# Patient Record
Sex: Male | Born: 1970 | Race: Black or African American | Hispanic: No | Marital: Married | Smoking: Current every day smoker
Health system: Southern US, Community
[De-identification: ages and names within clinical notes are randomized; demographics above are authoritative.]

## PROBLEM LIST (undated history)

## (undated) ENCOUNTER — Emergency Department (HOSPITAL_COMMUNITY): Admission: EM | Payer: Self-pay

## (undated) DIAGNOSIS — I10 Essential (primary) hypertension: Secondary | ICD-10-CM

## (undated) HISTORY — PX: APPENDECTOMY: SHX54

---

## 2016-05-08 ENCOUNTER — Ambulatory Visit (INDEPENDENT_AMBULATORY_CARE_PROVIDER_SITE_OTHER): Payer: Self-pay | Admitting: Physician Assistant

## 2016-05-08 ENCOUNTER — Encounter (INDEPENDENT_AMBULATORY_CARE_PROVIDER_SITE_OTHER): Payer: Self-pay | Admitting: Physician Assistant

## 2016-05-08 VITALS — BP 149/79 | HR 60 | Temp 97.8°F | Ht 69.0 in | Wt 237.0 lb

## 2016-05-08 DIAGNOSIS — K0381 Cracked tooth: Secondary | ICD-10-CM | POA: Insufficient documentation

## 2016-05-08 DIAGNOSIS — F1910 Other psychoactive substance abuse, uncomplicated: Secondary | ICD-10-CM | POA: Insufficient documentation

## 2016-05-08 DIAGNOSIS — I10 Essential (primary) hypertension: Secondary | ICD-10-CM

## 2016-05-08 DIAGNOSIS — G4700 Insomnia, unspecified: Secondary | ICD-10-CM | POA: Insufficient documentation

## 2016-05-08 DIAGNOSIS — F418 Other specified anxiety disorders: Secondary | ICD-10-CM

## 2016-05-08 DIAGNOSIS — L709 Acne, unspecified: Secondary | ICD-10-CM | POA: Insufficient documentation

## 2016-05-08 MED ORDER — SALICYLIC ACID 2 % EX PADS: 1.0000 "application " | MEDICATED_PAD | Freq: Two times a day (BID) | CUTANEOUS | 0 refills | Status: DC

## 2016-05-08 MED ORDER — HYDROCHLOROTHIAZIDE 12.5 MG PO TABS: 12.5000 mg | ORAL_TABLET | Freq: Every day | ORAL | 3 refills | Status: DC

## 2016-05-08 MED ORDER — SERTRALINE HCL 50 MG PO TABS: 50.0000 mg | ORAL_TABLET | Freq: Every day | ORAL | 3 refills | Status: DC

## 2016-05-08 MED ORDER — MELATONIN 5 MG PO TABS: 1.0000 | ORAL_TABLET | Freq: Every day | ORAL | 2 refills | Status: DC

## 2016-05-08 NOTE — Patient Instructions (Signed)
Insomnia Insomnia is a sleep disorder that makes it difficult to fall asleep or to stay asleep. Insomnia can cause tiredness (fatigue), low energy, difficulty concentrating, mood swings, and poor performance at work or school. There are three different ways to classify insomnia:  Difficulty falling asleep.  Difficulty staying asleep.  Waking up too early in the morning. Any type of insomnia can be long-term (chronic) or short-term (acute). Both are common. Short-term insomnia usually lasts for three months or less. Chronic insomnia occurs at least three times a week for longer than three months. What are the causes? Insomnia may be caused by another condition, situation, or substance, such as:  Anxiety.  Certain medicines.  Gastroesophageal reflux disease (GERD) or other gastrointestinal conditions.  Asthma or other breathing conditions.  Restless legs syndrome, sleep apnea, or other sleep disorders.  Chronic pain.  Menopause. This may include hot flashes.  Stroke.  Abuse of alcohol, tobacco, or illegal drugs.  Depression.  Caffeine.  Neurological disorders, such as Alzheimer disease.  An overactive thyroid (hyperthyroidism). The cause of insomnia may not be known. What increases the risk? Risk factors for insomnia include:  Gender. Women are more commonly affected than men.  Age. Insomnia is more common as you get older.  Stress. This may involve your professional or personal life.  Income. Insomnia is more common in people with lower income.  Lack of exercise.  Irregular work schedule or night shifts.  Traveling between different time zones. What are the signs or symptoms? If you have insomnia, trouble falling asleep or trouble staying asleep is the main symptom. This may lead to other symptoms, such as:  Feeling fatigued.  Feeling nervous about going to sleep.  Not feeling rested in the morning.  Having trouble concentrating.  Feeling irritable,  anxious, or depressed. How is this treated? Treatment for insomnia depends on the cause. If your insomnia is caused by an underlying condition, treatment will focus on addressing the condition. Treatment may also include:  Medicines to help you sleep.  Counseling or therapy.  Lifestyle adjustments. Follow these instructions at home:  Take medicines only as directed by your health care provider.  Keep regular sleeping and waking hours. Avoid naps.  Keep a sleep diary to help you and your health care provider figure out what could be causing your insomnia. Include:  When you sleep.  When you wake up during the night.  How well you sleep.  How rested you feel the next day.  Any side effects of medicines you are taking.  What you eat and drink.  Make your bedroom a comfortable place where it is easy to fall asleep:  Put up shades or special blackout curtains to block light from outside.  Use a white noise machine to block noise.  Keep the temperature cool.  Exercise regularly as directed by your health care provider. Avoid exercising right before bedtime.  Use relaxation techniques to manage stress. Ask your health care provider to suggest some techniques that may work well for you. These may include:  Breathing exercises.  Routines to release muscle tension.  Visualizing peaceful scenes.  Cut back on alcohol, caffeinated beverages, and cigarettes, especially close to bedtime. These can disrupt your sleep.  Do not overeat or eat spicy foods right before bedtime. This can lead to digestive discomfort that can make it hard for you to sleep.  Limit screen use before bedtime. This includes:  Watching TV.  Using your smartphone, tablet, and computer.  Stick to a   routine. This can help you fall asleep faster. Try to do a quiet activity, brush your teeth, and go to bed at the same time each night.  Get out of bed if you are still awake after 15 minutes of trying to  sleep. Keep the lights down, but try reading or doing a quiet activity. When you feel sleepy, go back to bed.  Make sure that you drive carefully. Avoid driving if you feel very sleepy.  Keep all follow-up appointments as directed by your health care provider. This is important. Contact a health care provider if:  You are tired throughout the day or have trouble in your daily routine due to sleepiness.  You continue to have sleep problems or your sleep problems get worse. Get help right away if:  You have serious thoughts about hurting yourself or someone else. This information is not intended to replace advice given to you by your health care provider. Make sure you discuss any questions you have with your health care provider. Document Released: 02/01/2000 Document Revised: 07/06/2015 Document Reviewed: 11/04/2013 Elsevier Interactive Patient Education  2017 [employer] Hypertension Hypertension, commonly called high blood pressure, is when the force of blood pumping through the arteries is too strong. The arteries are the blood vessels that carry blood from the heart throughout the body. Hypertension forces the heart to work harder to pump blood and may cause arteries to become narrow or stiff. Having untreated or uncontrolled hypertension can cause heart attacks, strokes, kidney disease, and other problems. A blood pressure reading consists of a higher number over a lower number. Ideally, your blood pressure should be below 120/80. The first ("top") number is called the systolic pressure. It is a measure of the pressure in your arteries as your heart beats. The second ("bottom") number is called the diastolic pressure. It is a measure of the pressure in your arteries as the heart relaxes. What are the causes? The cause of this condition is not known. What increases the risk? Some risk factors for high blood pressure are under your control. Others are not. Factors you can change    Smoking.  Having type 2 diabetes mellitus, high cholesterol, or both.  Not getting enough exercise or physical activity.  Being overweight.  Having too much fat, sugar, calories, or salt (sodium) in your diet.  Drinking too much alcohol. Factors that are difficult or impossible to change   Having chronic kidney disease.  Having a family history of high blood pressure.  Age. Risk increases with age.  Race. You may be at higher risk if you are African-American.  Gender. Men are at higher risk than women before age 47. After age 47, women are at higher risk than men.  Having obstructive sleep apnea.  Stress. What are the signs or symptoms? Extremely high blood pressure (hypertensive crisis) may cause:  Headache.  Anxiety.  Shortness of breath.  Nosebleed.  Nausea and vomiting.  Severe chest pain.  Jerky movements you cannot control (seizures). How is this diagnosed? This condition is diagnosed by measuring your blood pressure while you are seated, with your arm resting on a surface. The cuff of the blood pressure monitor will be placed directly against the skin of your upper arm at the level of your heart. It should be measured at least twice using the same arm. Certain conditions can cause a difference in blood pressure between your right and left arms. Certain factors can cause blood pressure readings to be lower or higher  than normal (elevated) for a short period of time:  When your blood pressure is higher when you are in a health care provider's office than when you are at home, this is called white coat hypertension. Most people with this condition do not need medicines.  When your blood pressure is higher at home than when you are in a health care provider's office, this is called masked hypertension. Most people with this condition may need medicines to control blood pressure. If you have a high blood pressure reading during one visit or you have normal blood  pressure with other risk factors:  You may be asked to return on a different day to have your blood pressure checked again.  You may be asked to monitor your blood pressure at home for 1 week or longer. If you are diagnosed with hypertension, you may have other blood or imaging tests to help your health care provider understand your overall risk for other conditions. How is this treated? This condition is treated by making healthy lifestyle changes, such as eating healthy foods, exercising more, and reducing your alcohol intake. Your health care provider may prescribe medicine if lifestyle changes are not enough to get your blood pressure under control, and if:  Your systolic blood pressure is above 130.  Your diastolic blood pressure is above 80. Your personal target blood pressure may vary depending on your medical conditions, your age, and other factors. Follow these instructions at home: Eating and drinking   Eat a diet that is high in fiber and potassium, and low in sodium, added sugar, and fat. An example eating plan is called the DASH (Dietary Approaches to Stop Hypertension) diet. To eat this way:  Eat plenty of fresh fruits and vegetables. Try to fill half of your plate at each meal with fruits and vegetables.  Eat whole grains, such as whole wheat pasta, brown rice, or whole grain bread. Fill about one quarter of your plate with whole grains.  Eat or drink low-fat dairy products, such as skim milk or low-fat yogurt.  Avoid fatty cuts of meat, processed or cured meats, and poultry with skin. Fill about one quarter of your plate with lean proteins, such as fish, chicken without skin, beans, eggs, and tofu.  Avoid premade and processed foods. These tend to be higher in sodium, added sugar, and fat.  Reduce your daily sodium intake. Most people with hypertension should eat less than 1,500 mg of sodium a day.  Limit alcohol intake to no more than 1 drink a day for nonpregnant women  and 2 drinks a day for men. One drink equals 12 oz of beer, 5 oz of wine, or 1 oz of hard liquor. Lifestyle   Work with your health care provider to maintain a healthy body weight or to lose weight. Ask what an ideal weight is for you.  Get at least 30 minutes of exercise that causes your heart to beat faster (aerobic exercise) most days of the week. Activities may include walking, swimming, or biking.  Include exercise to strengthen your muscles (resistance exercise), such as pilates or lifting weights, as part of your weekly exercise routine. Try to do these types of exercises for 30 minutes at least 3 days a week.  Do not use any products that contain nicotine or tobacco, such as cigarettes and e-cigarettes. If you need help quitting, ask your health care provider.  Monitor your blood pressure at home as told by your health care provider.  Keep  all follow-up visits as told by your health care provider. This is important. Medicines   Take over-the-counter and prescription medicines only as told by your health care provider. Follow directions carefully. Blood pressure medicines must be taken as prescribed.  Do not skip doses of blood pressure medicine. Doing this puts you at risk for problems and can make the medicine less effective.  Ask your health care provider about side effects or reactions to medicines that you should watch for. Contact a health care provider if:  You think you are having a reaction to a medicine you are taking.  You have headaches that keep coming back (recurring).  You feel dizzy.  You have swelling in your ankles.  You have trouble with your vision. Get help right away if:  You develop a severe headache or confusion.  You have unusual weakness or numbness.  You feel faint.  You have severe pain in your chest or abdomen.  You vomit repeatedly.  You have trouble breathing. Summary  Hypertension is when the force of blood pumping through your  arteries is too strong. If this condition is not controlled, it may put you at risk for serious complications.  Your personal target blood pressure may vary depending on your medical conditions, your age, and other factors. For most people, a normal blood pressure is less than 120/80.  Hypertension is treated with lifestyle changes, medicines, or a combination of both. Lifestyle changes include weight loss, eating a healthy, low-sodium diet, exercising more, and limiting alcohol. This information is not intended to replace advice given to you by your health care provider. Make sure you discuss any questions you have with your health care provider. Document Released: 02/03/2005 Document Revised: 01/02/2016 Document Reviewed: 01/02/2016 Elsevier Interactive Patient Education  2017 [employer].

## 2016-05-08 NOTE — Progress Notes (Signed)
Subjective:  Patient ID: Matthew Kaufman, male    DOB: 12/24/1970  Age: 46 y.o. MRN: [id number]  CC: establish care  HPI Matthew Kaufman is a 46 y.o. male with a PMH of HTN, Substance abuse, Depression, Anxiety, and Insomnia presents to address these issues. He reports sobriety for years now. Feels depressed and anxious about his current situation but is [facility]. Insomnia has frustrated his sleep but feels he may be somewhat better recently. No known etiology for insomnia. Denies CP, SOB, HA, abdominal pain, f/c/n/v, rash, or GI/GU sxs.      ROS Review of Systems  Constitutional: Negative for chills, fever and malaise/fatigue.  HENT:       Cracked incisor  Eyes: Negative for blurred vision.  Respiratory: Negative for shortness of breath.   Cardiovascular: Negative for chest pain and palpitations.  Gastrointestinal: Negative for abdominal pain and nausea.  Genitourinary: Negative for dysuria and hematuria.  Musculoskeletal: Negative for joint pain and myalgias.  Skin: Positive for rash (on buttock bilaterally, suspects acne).  Neurological: Negative for tingling and headaches.  Psychiatric/Behavioral: Positive for depression. The patient is nervous/anxious and has insomnia.     Objective:  BP (!) 149/79 (BP Location: Left Arm, Patient Position: Sitting, Cuff Size: Large)   Pulse 60   Temp 97.8 F (36.6 C) (Oral)   Ht 5\' 9"  (1.753 m)   Wt 237 lb (107.5 kg)   SpO2 100%   BMI 35.00 kg/m   BP/Weight 05/08/2016  Systolic BP 149  Diastolic BP 79  Wt. (Lbs) 237  BMI 35      Physical Exam  Constitutional: He is oriented to person, place, and time.  Well developed, overweight, NAD, polite  HENT:  Head: Normocephalic and atraumatic.  Tooth number nine damaged distally with protrusion of 2mm of thin wire at the tip of tooth  Eyes: Conjunctivae are normal. No scleral icterus.  Neck: Normal range of motion. Neck supple. No thyromegaly present.   Cardiovascular: Normal rate, regular rhythm and normal heart sounds.   Pulmonary/Chest: Effort normal and breath sounds normal.  Abdominal: Soft. Bowel sounds are normal. There is no tenderness.  Musculoskeletal: He exhibits no edema.  Neurological: He is alert and oriented to person, place, and time.  Skin: Skin is warm and dry. No rash noted. He is not diaphoretic. No erythema. No pallor.  Psychiatric: He has a normal mood and affect. His behavior is normal. Thought content normal.  Vitals reviewed.    Assessment & Plan:   1. Hypertension, unspecified type - CBC With Differential - Comprehensive metabolic panel - TSH - Lipid Panel - hydrochlorothiazide (HYDRODIURIL) 12.5 MG tablet; Take 1 tablet (12.5 mg total) by mouth daily.  Dispense: 90 tablet; Refill: 3  2. Depression with anxiety - sertraline (ZOLOFT) 50 MG tablet; Take 1 tablet (50 mg total) by mouth daily.  Dispense: 30 tablet; Refill: 3  3. Insomnia, unspecified type - sertraline (ZOLOFT) 50 MG tablet; Take 1 tablet (50 mg total) by mouth daily.  Dispense: 30 tablet; Refill: 3 - Melatonin 5 MG TABS; Take 1 tablet (5 mg total) by mouth at bedtime.  Dispense: 30 tablet; Refill: 2  4. Substance abuse - Reports sobriety - Will assess drug panel at future visit.  5. Cracked tooth - Wire from implant is visibly protruding from tooth. Concern for damage to lips and subsequent infections. - Ambulatory referral to Dentistry  6. Acne, unspecified acne type - Acne on buttocks. - Salicylic Acid 2 %  PADS; Apply 1 application topically 2 (two) times daily.  Dispense: 60 each; Refill: 0   Meds ordered this encounter  Medications  . hydrochlorothiazide (HYDRODIURIL) 12.5 MG tablet    Sig: Take 1 tablet (12.5 mg total) by mouth daily.    Dispense:  90 tablet    Refill:  3    Order Specific Question:   Supervising Provider    Answer:   Quentin Angst [id number]  . sertraline (ZOLOFT) 50 MG tablet    Sig: Take 1 tablet  (50 mg total) by mouth daily.    Dispense:  30 tablet    Refill:  3    Order Specific Question:   Supervising Provider    Answer:   Quentin Angst [id number]  . Melatonin 5 MG TABS    Sig: Take 1 tablet (5 mg total) by mouth at bedtime.    Dispense:  30 tablet    Refill:  2    Order Specific Question:   Supervising Provider    Answer:   Quentin Angst [id number]  . Salicylic Acid 2 % PADS    Sig: Apply 1 application topically 2 (two) times daily.    Dispense:  60 each    Refill:  0    Order Specific Question:   Supervising Provider    Answer:   Quentin Angst [id number]    Follow-up: Return in about 4 weeks (around 06/05/2016) for f/u htn, depression, and insomnia.   Loletta Specter PA

## 2016-05-09 LAB — COMPREHENSIVE METABOLIC PANEL
ALK PHOS: 71 IU/L (ref 39–117)
ALT: 26 IU/L (ref 0–44)
AST: 33 IU/L (ref 0–40)
Albumin/Globulin Ratio: 1.8 (ref 1.2–2.2)
Albumin: 4.6 g/dL (ref 3.5–5.5)
BUN / CREAT RATIO: 12 (ref 9–20)
BUN: 12 mg/dL (ref 6–24)
Bilirubin Total: 0.3 mg/dL (ref 0.0–1.2)
CO2: 21 mmol/L (ref 18–29)
CREATININE: 1.02 mg/dL (ref 0.76–1.27)
Calcium: 9.2 mg/dL (ref 8.7–10.2)
Chloride: 100 mmol/L (ref 96–106)
GFR calc Af Amer: 101 mL/min/{1.73_m2} (ref >59)
GFR calc non Af Amer: 87 mL/min/{1.73_m2} (ref >59)
GLOBULIN, TOTAL: 2.6 g/dL (ref 1.5–4.5)
GLUCOSE: 110 mg/dL — AB (ref 65–99)
Potassium: 4.4 mmol/L (ref 3.5–5.2)
SODIUM: 141 mmol/L (ref 134–144)
Total Protein: 7.2 g/dL (ref 6.0–8.5)

## 2016-05-09 LAB — LIPID PANEL
CHOLESTEROL TOTAL: 199 mg/dL (ref 100–199)
Chol/HDL Ratio: 4.1 ratio units (ref 0.0–5.0)
HDL: 49 mg/dL (ref >39)
LDL Calculated: 122 mg/dL — ABNORMAL HIGH (ref 0–99)
Triglycerides: 141 mg/dL (ref 0–149)
VLDL CHOLESTEROL CAL: 28 mg/dL (ref 5–40)

## 2016-05-09 LAB — CBC WITH DIFFERENTIAL
Basophils Absolute: 0 10*3/uL (ref 0.0–0.2)
Basos: 0 %
EOS (ABSOLUTE): 0.1 10*3/uL (ref 0.0–0.4)
Eos: 1 %
HEMATOCRIT: 40.6 % (ref 37.5–51.0)
Hemoglobin: 13.4 g/dL (ref 13.0–17.7)
IMMATURE GRANULOCYTES: 0 %
Immature Grans (Abs): 0 10*3/uL (ref 0.0–0.1)
LYMPHS ABS: 3.2 10*3/uL — AB (ref 0.7–3.1)
Lymphs: 41 %
MCH: 27.5 pg (ref 26.6–33.0)
MCHC: 33 g/dL (ref 31.5–35.7)
MCV: 83 fL (ref 79–97)
MONOS ABS: 0.8 10*3/uL (ref 0.1–0.9)
Monocytes: 10 %
NEUTROS ABS: 3.7 10*3/uL (ref 1.4–7.0)
Neutrophils: 48 %
RBC: 4.87 x10E6/uL (ref 4.14–5.80)
RDW: 15.2 % (ref 12.3–15.4)
WBC: 7.8 10*3/uL (ref 3.4–10.8)

## 2016-05-09 LAB — TSH: TSH: 0.706 u[IU]/mL (ref 0.450–4.500)

## 2016-05-21 ENCOUNTER — Encounter (INDEPENDENT_AMBULATORY_CARE_PROVIDER_SITE_OTHER): Payer: Self-pay

## 2016-06-13 ENCOUNTER — Ambulatory Visit (HOSPITAL_COMMUNITY)
Admission: EM | Admit: 2016-06-13 | Discharge: 2016-06-13 | Disposition: A | Discharge status: Home / Self Care | Payer: Self-pay | Attending: Family Medicine | Admitting: Family Medicine

## 2016-06-13 ENCOUNTER — Encounter (HOSPITAL_COMMUNITY): Payer: Self-pay | Admitting: Family Medicine

## 2016-06-13 DIAGNOSIS — K529 Noninfective gastroenteritis and colitis, unspecified: Secondary | ICD-10-CM

## 2016-06-13 DIAGNOSIS — R197 Diarrhea, unspecified: Secondary | ICD-10-CM

## 2016-06-13 DIAGNOSIS — R110 Nausea: Secondary | ICD-10-CM

## 2016-06-13 MED ORDER — ONDANSETRON 4 MG PO TBDP
4.0000 mg | ORAL_TABLET | Freq: Once | ORAL | Status: AC
  Administered 2016-06-13: 4 mg via ORAL

## 2016-06-13 MED ORDER — ONDANSETRON 4 MG PO TBDP
ORAL_TABLET | ORAL | Status: AC
  Filled 2016-06-13: qty 1

## 2016-06-13 NOTE — ED Triage Notes (Signed)
Pt here for N,V,D that started yesterday. sts that he has been around someone that had the same symptoms and sleeping in tight quarters.

## 2016-06-13 NOTE — ED Provider Notes (Signed)
CSN: [id number]     Arrival date & time 06/13/16  1559 History   None    Chief Complaint  Patient presents with  . Nausea  . Emesis  . Diarrhea   (Consider location/radiation/quality/duration/timing/severity/associated sxs/prior Treatment) Patient c/o NVD x 1 day.     The history is provided by the patient.  Emesis  Severity:  Moderate Duration:  1 day Timing:  Constant Number of daily episodes:  2 Quality:  Unable to specify Able to tolerate:  Liquids Progression:  Worsening Recent urination:  Normal Relieved by:  Nothing Worsened by:  Nothing Associated symptoms: diarrhea   Diarrhea  Associated symptoms: vomiting     History reviewed. No pertinent past medical history. History reviewed. No pertinent surgical history. History reviewed. No pertinent family history. Social History  Substance Use Topics  . Smoking status: Former Games developer  . Smokeless tobacco: Never Used  . Alcohol use Not on file    Review of Systems  Constitutional: Positive for fatigue.  HENT: Negative.   Eyes: Negative.   Respiratory: Negative.   Cardiovascular: Negative.   Gastrointestinal: Positive for diarrhea and vomiting.  Endocrine: Negative.   Genitourinary: Negative.   Musculoskeletal: Negative.   Allergic/Immunologic: Negative.   Neurological: Negative.   Hematological: Negative.   Psychiatric/Behavioral: Negative.     Allergies  Amoxicillin and Doxycycline  Home Medications   Prior to Admission medications   Medication Sig Start Date End Date Taking? Authorizing Provider  hydrochlorothiazide (HYDRODIURIL) 12.5 MG tablet Take 1 tablet (12.5 mg total) by mouth daily. 05/08/16   Roger Mariana Arn, PA-C  Melatonin 5 MG TABS Take 1 tablet (5 mg total) by mouth at bedtime. 05/08/16   Roger Mariana Arn, PA-C  Salicylic Acid 2 % PADS Apply 1 application topically 2 (two) times daily. 05/08/16   Roger Mariana Arn, PA-C  sertraline (ZOLOFT) 50 MG tablet Take 1 tablet (50 mg total) by  mouth daily. 05/08/16   Loletta Specter, PA-C   Meds Ordered and Administered this Visit   Medications  ondansetron (ZOFRAN-ODT) disintegrating tablet 4 mg (4 mg Oral Given 06/13/16 1650)    BP 128/83   Pulse 69   Temp 98.4 F (36.9 C)   Resp 18   SpO2 100%  No data found.   Physical Exam  Constitutional: He is oriented to person, place, and time. He appears well-developed and well-nourished.  HENT:  Head: Normocephalic and atraumatic.  Right Ear: External ear normal.  Left Ear: External ear normal.  Mouth/Throat: Oropharynx is clear and moist.  Eyes: Conjunctivae and EOM are normal. Pupils are equal, round, and reactive to light.  Neck: Normal range of motion. Neck supple.  Cardiovascular: Normal rate, regular rhythm and normal heart sounds.   Pulmonary/Chest: Effort normal and breath sounds normal.  Abdominal: Soft. Bowel sounds are normal.  Neurological: He is alert and oriented to person, place, and time.  Nursing note and vitals reviewed.   Urgent Care Course     Procedures (including critical care time)  Labs Review Labs Reviewed - No data to display  Imaging Review No results found.   Visual Acuity Review  Right Eye Distance:   Left Eye Distance:   Bilateral Distance:    Right Eye Near:   Left Eye Near:    Bilateral Near:         MDM   1. Gastroenteritis   2. Nausea   3. Diarrhea, unspecified type    Zofran ODT  one po now  Push  po fluids, rest, tylenol and motrin otc prn as directed for fever, arthralgias, and myalgias.  Follow up prn if sx's continue or persist.  Note to be on bedrest at shelter      Deatra Canter, FNP 06/13/16 1738

## 2016-07-02 ENCOUNTER — Ambulatory Visit (INDEPENDENT_AMBULATORY_CARE_PROVIDER_SITE_OTHER): Payer: Self-pay | Admitting: Physician Assistant

## 2016-07-24 NOTE — Congregational Nurse Program (Signed)
Congregational Nurse Program Note  Date of Encounter: 06/30/2016  Past Medical History: No past medical history on file.  Encounter Details:     CNP Questionnaire - 06/30/16 1756      Patient Demographics   Is this a new or existing patient? New   Patient is considered a/an Not Applicable   Race African-American/Black     Patient Assistance   Location of Patient Assistance Not Applicable   Patient's financial/insurance status Self-Pay (Uninsured);Low Income   Uninsured Patient ([place] [employer]) Yes   Interventions Assisted patient in making appt.   Patient referred to apply for the following financial assistance Not Applicable   Food insecurities addressed Not Applicable   Transportation assistance No   Assistance securing medications No   Educational health offerings Navigating the healthcare system     Encounter Details   Primary purpose of visit Chronic Illness/Condition Visit;Navigating the Healthcare System   Was an Emergency Department visit averted? Not Applicable   Does patient have a medical provider? No   Patient referred to Clinic   Was a mental health screening completed? (GAINS tool) No   Does patient have dental issues? No   Does patient have vision issues? No   Does your patient have an abnormal blood pressure today? No   Since previous encounter, have you referred patient for abnormal blood pressure that resulted in a new diagnosis or medication change? No   Does your patient have an abnormal blood glucose today? No   Since previous encounter, have you referred patient for abnormal blood glucose that resulted in a new diagnosis or medication change? No     Client reports was [facility] and forgot his meds.  States his meds were discarded by the staff.  Is on an anti-hypertensive.  B/P 140/96.  Strongly encouraged him to see Lavinia SharpsMary Ann Placey NP at [facility] for followup and medication management

## 2016-11-04 ENCOUNTER — Emergency Department (HOSPITAL_COMMUNITY)
Admission: EM | Admit: 2016-11-04 | Discharge: 2016-11-04 | Disposition: A | Discharge status: Home / Self Care | Payer: Self-pay | Attending: Emergency Medicine | Admitting: Emergency Medicine

## 2016-11-04 ENCOUNTER — Emergency Department (HOSPITAL_COMMUNITY): Payer: Self-pay

## 2016-11-04 ENCOUNTER — Encounter (HOSPITAL_COMMUNITY): Payer: Self-pay | Admitting: *Deleted

## 2016-11-04 DIAGNOSIS — Z202 Contact with and (suspected) exposure to infections with a predominantly sexual mode of transmission: Secondary | ICD-10-CM | POA: Insufficient documentation

## 2016-11-04 DIAGNOSIS — R1084 Generalized abdominal pain: Secondary | ICD-10-CM | POA: Insufficient documentation

## 2016-11-04 DIAGNOSIS — I10 Essential (primary) hypertension: Secondary | ICD-10-CM | POA: Insufficient documentation

## 2016-11-04 DIAGNOSIS — F17210 Nicotine dependence, cigarettes, uncomplicated: Secondary | ICD-10-CM | POA: Insufficient documentation

## 2016-11-04 DIAGNOSIS — Z79899 Other long term (current) drug therapy: Secondary | ICD-10-CM | POA: Insufficient documentation

## 2016-11-04 HISTORY — DX: Essential (primary) hypertension: I10

## 2016-11-04 LAB — URINALYSIS, ROUTINE W REFLEX MICROSCOPIC
BACTERIA UA: NONE SEEN
BILIRUBIN URINE: NEGATIVE
Glucose, UA: NEGATIVE mg/dL
Ketones, ur: NEGATIVE mg/dL
Leukocytes, UA: NEGATIVE
Nitrite: NEGATIVE
PH: 5 (ref 5.0–8.0)
PROTEIN: NEGATIVE mg/dL
Specific Gravity, Urine: 1.018 (ref 1.005–1.030)

## 2016-11-04 MED ORDER — GENTAMICIN SULFATE 40 MG/ML IJ SOLN
240.0000 mg | Freq: Once | INTRAMUSCULAR | Status: AC
  Administered 2016-11-04: 240 mg via INTRAMUSCULAR
  Filled 2016-11-04: qty 6

## 2016-11-04 MED ORDER — AZITHROMYCIN 250 MG PO TABS
2000.0000 mg | ORAL_TABLET | Freq: Once | ORAL | Status: AC
  Administered 2016-11-04: 2000 mg via ORAL
  Filled 2016-11-04: qty 8

## 2016-11-04 NOTE — ED Notes (Signed)
Pt alert and oriented. Brought back into triage for re-eval. Pt appears in nad. Wait time acknowledged. Appears in nad.

## 2016-11-04 NOTE — ED Notes (Signed)
Pt to xray

## 2016-11-04 NOTE — Discharge Instructions (Signed)
Please read attached information regarding your condition. Follow-up with the results of remaining lab work when available. Return to ED for worsening pain, increase in blood, severe abdominal pain, fevers, increased vomiting.

## 2016-11-04 NOTE — ED Provider Notes (Signed)
[facility]-EMERGENCY DEPT Provider Note   CSN: [id number] Arrival date & time: 11/04/16  0015     History   Chief Complaint Chief Complaint  Patient presents with  . Hematuria    HPI Matthew Kaufman is a 46 y.o. male.  HPI  Patient presents to ED for evaluation of one episode of blood in ejaculate 2 days ago. He also reports intermittent abdominal pain in the morning. He has not noticed blood in the ejaculate since the incident that occurred 2 days ago. He denies any previous history of similar symptoms. He states that he is concerned about STD because he thinks his partner is cheating on him. He denies any hematuria, history of gastric ulcers, chronic NSAID use, history of kidney stones, history of liver disease, history of prostate issues, fever, bowel changes, vomiting, nausea. He reports daily alcohol and tobacco use. He also reports marijuana use.  Past Medical History:  Diagnosis Date  . Hypertension     Patient Active Problem List   Diagnosis Date Noted  . HTN (hypertension) 05/08/2016  . Depression with anxiety 05/08/2016  . Insomnia 05/08/2016  . Substance abuse 05/08/2016  . Cracked tooth 05/08/2016  . Acne 05/08/2016    Past Surgical History:  Procedure Laterality Date  . APPENDECTOMY         Home Medications    Prior to Admission medications   Medication Sig Start Date End Date Taking? Authorizing Provider  hydrochlorothiazide (HYDRODIURIL) 12.5 MG tablet Take 1 tablet (12.5 mg total) by mouth daily. 05/08/16   Loletta Specter, PA-C  Melatonin 5 MG TABS Take 1 tablet (5 mg total) by mouth at bedtime. 05/08/16   Loletta Specter, PA-C  Salicylic Acid 2 % PADS Apply 1 application topically 2 (two) times daily. 05/08/16   Loletta Specter, PA-C  sertraline (ZOLOFT) 50 MG tablet Take 1 tablet (50 mg total) by mouth daily. 05/08/16   Loletta Specter, PA-C    Family History No family history on file.  Social History Social History  Substance Use  Topics  . Smoking status: Current Every Day Smoker  . Smokeless tobacco: Never Used  . Alcohol use Yes     Allergies   Amoxicillin and Doxycycline   Review of Systems Review of Systems  Constitutional: Negative for appetite change, chills and fever.  HENT: Negative for ear pain, rhinorrhea, sneezing and sore throat.   Eyes: Negative for photophobia and visual disturbance.  Respiratory: Negative for cough, chest tightness, shortness of breath and wheezing.   Cardiovascular: Negative for chest pain and palpitations.  Gastrointestinal: Positive for abdominal pain. Negative for blood in stool, constipation, diarrhea, nausea and vomiting.  Genitourinary: Negative for difficulty urinating, dysuria, hematuria and urgency.       Bloody ejaculate  Musculoskeletal: Negative for myalgias.  Skin: Negative for rash.  Neurological: Negative for dizziness, weakness and light-headedness.     Physical Exam Updated Vital Signs BP (!) 149/97 (BP Location: Right Arm)   Pulse 76   Temp 98 F (36.7 C) (Oral)   Resp 16   Ht  (1.753 m)   Wt 92.5 kg (204 lb)   SpO2 99%   BMI 30.13 kg/m   Physical Exam  Constitutional: He appears well-developed and well-nourished. No distress.  HENT:  Head: Normocephalic and atraumatic.  Eyes: Conjunctivae and EOM are normal. No scleral icterus.  Neck: Normal range of motion.  Cardiovascular: Normal rate, regular rhythm and normal heart sounds.   Pulmonary/Chest: Effort normal and breath  sounds normal. No respiratory distress.  Abdominal: Soft. Bowel sounds are normal. He exhibits no distension. There is no tenderness.  No tenderness to palpation of the abdomen currently.  Neurological: He is alert.  Skin: No rash noted. He is not diaphoretic.  Psychiatric: He has a normal mood and affect.  Nursing note and vitals reviewed.    ED Treatments / Results  Labs (all labs ordered are listed, but only abnormal results are displayed) Labs Reviewed    URINALYSIS, ROUTINE W REFLEX MICROSCOPIC - Abnormal; Notable for the following:       Result Value   Hgb urine dipstick SMALL (*)    Squamous Epithelial / LPF 0-5 (*)    All other components within normal limits  GC/CHLAMYDIA PROBE AMP ([place]) NOT AT [facility]    EKG  EKG Interpretation None       Radiology Dg Abdomen Acute W/chest  Result Date: 11/04/2016 CLINICAL DATA:  Blood in semen EXAM: DG ABDOMEN ACUTE W/ 1V CHEST COMPARISON:  None. FINDINGS: There is no evidence of dilated bowel loops or free intraperitoneal air. No radiopaque calculi or other significant radiographic abnormality is seen. Heart size and mediastinal contours are within normal limits. Both lungs are clear. Postsurgical changes in the right lower quadrant are noted consistent with prior appendectomy. IMPRESSION: Negative abdominal radiographs.  No acute cardiopulmonary disease. Electronically Signed   By: Alcide Clever M.D.   On: 11/04/2016 10:11    Procedures Procedures (including critical care time)  Medications Ordered in ED Medications  gentamicin (GARAMYCIN) injection 240 mg (240 mg Intramuscular Given 11/04/16 1026)  azithromycin (ZITHROMAX) tablet 2,000 mg (2,000 mg Oral Given 11/04/16 1610)     Initial Impression / Assessment and Plan / ED Course  I have reviewed the triage vital signs and the nursing notes.  Pertinent labs & imaging results that were available during my care of the patient were reviewed by me and considered in my medical decision making (see chart for details).     Patient presents to ED for evaluation of small amount of blood inject relation 2 days ago. States that symptoms have not occurred since the incident. He also reports intermittent abdominal pain for the past several weeks. States that he wants to make sure that he does not have an ulcer. He denies any vomiting, lightheadedness, lower abdominal pain, urinary symptoms, fever, chills, prostate issues. Physical exam there is  no tenderness to palpation of the abdomen currently. He is nontoxic-appearing and in no acute distress. He is concerned about STD exposure because he is concerned that his partner is cheating on him. GC chlamydia probe collected via urine patient was treated empirically. Due to his amoxicillin and doxycycline allergy, I contacted pharmacy who recommends gentamicin and azithromycin treatment. Patient is unsure if he has tried cephalosporins in the past. Abdominal x-ray and chest x-ray were obtained and showed no acute abnormalities. I encouraged patient to follow up with results of remaining lab work at this time. I suspect that his symptoms are most likely due to STD exposure based on his lack of liver disease, prostate issues in the past. Patient states that he will follow-up with PCP for high blood pressure. Patient appears stable for discharge at this time. Strict return precautions given.  Final Clinical Impressions(s) / ED Diagnoses   Final diagnoses:  Possible exposure to STD    New Prescriptions New Prescriptions   No medications on file     Dietrich Pates, [place] 11/04/16 1042  Melene Plan, DO 11/04/16 1406

## 2016-11-04 NOTE — ED Notes (Addendum)
Pt was approached in the corner of the lobby and found with headphones on. Pt's armband was checked and was told that we called his name for a room, pt stated he might have been outside smoking at the time.

## 2016-11-04 NOTE — ED Triage Notes (Signed)
Pt has noticed blood in semen after ejaculation. Reports burning abd pain only in the morning

## 2016-11-04 NOTE — ED Notes (Signed)
Did not respond to name X3

## 2016-12-22 ENCOUNTER — Ambulatory Visit (HOSPITAL_COMMUNITY)
Admission: RE | Admit: 2016-12-22 | Discharge: 2016-12-22 | Disposition: A | Discharge status: Home / Self Care | Payer: Self-pay | Attending: Psychiatry | Admitting: Psychiatry

## 2016-12-22 DIAGNOSIS — Z79899 Other long term (current) drug therapy: Secondary | ICD-10-CM | POA: Insufficient documentation

## 2016-12-22 DIAGNOSIS — F1210 Cannabis abuse, uncomplicated: Secondary | ICD-10-CM | POA: Insufficient documentation

## 2016-12-22 DIAGNOSIS — I10 Essential (primary) hypertension: Secondary | ICD-10-CM | POA: Insufficient documentation

## 2016-12-22 DIAGNOSIS — Z046 Encounter for general psychiatric examination, requested by authority: Secondary | ICD-10-CM | POA: Insufficient documentation

## 2016-12-22 DIAGNOSIS — F1010 Alcohol abuse, uncomplicated: Secondary | ICD-10-CM | POA: Insufficient documentation

## 2016-12-22 DIAGNOSIS — R45851 Suicidal ideations: Secondary | ICD-10-CM | POA: Insufficient documentation

## 2016-12-22 DIAGNOSIS — F329 Major depressive disorder, single episode, unspecified: Secondary | ICD-10-CM | POA: Insufficient documentation

## 2016-12-22 DIAGNOSIS — F1410 Cocaine abuse, uncomplicated: Secondary | ICD-10-CM | POA: Insufficient documentation

## 2016-12-22 DIAGNOSIS — F1910 Other psychoactive substance abuse, uncomplicated: Secondary | ICD-10-CM | POA: Insufficient documentation

## 2016-12-22 DIAGNOSIS — F17200 Nicotine dependence, unspecified, uncomplicated: Secondary | ICD-10-CM | POA: Insufficient documentation

## 2016-12-22 NOTE — H&P (Signed)
Behavioral Health Medical Screening Exam  Matthew Kaufman is an 46 y.o. male, [facility], previously evaluated per TTS, Matthew Kaufman endorses suicidal ideations.  Has been thinking of cutting his wrists.  He has had a previous suicide attempt by same.  Patient has been very depressed.  His sister died in March of this year.  He had been living in [place] and came up because of that.  Patient has family that lives in [place].  Patient has been living with a girlfriend.  He and her have had a falling out and patient does not have housing at this time.  Patient says he has racing thoughts about wanting to harm her.  Although he says he has thoughts of killing her, he does not intend to or have a plan to.  Patient says he has been in jail for a long time (27 years cumulative) and does not want to go back.  Patient says he has times when he hears voices but that it is not all the time.  He says it is usually when he is having some agitation or anger.  Patient says he gets to feeling paranoid because he thinks someone may be wanting to do him harm .  He says that this is because of his past in [place] and he thinks at times someone may recognize him and do him harm.  Patient says he drinks two 5th of wine and a 6 pack of beer daily and last use was today in the amount of 4 beers.  Patient smokes marijuana daily but cannot quantify how much, last use today.  Patient also uses cocaine and this frequency and amount varies according to finances, last use today.    Total Time spent with patient: 20 minutes  Psychiatric Specialty Exam: Physical Exam  Constitutional: He is oriented to person, place, and time. He appears well-developed and well-nourished. No distress.  HENT:  Head: Normocephalic.  Eyes: Pupils are equal, round, and reactive to light.  Respiratory: Effort normal and breath sounds normal.  Neurological: He is alert and oriented to person, place, and time. No  cranial nerve deficit.  Skin: Skin is warm and dry. He is not diaphoretic.  Psychiatric: His speech is normal. His mood appears anxious. He is agitated and withdrawn. Thought content is paranoid. Cognition and memory are impaired. He expresses impulsivity. He exhibits a depressed mood. He expresses suicidal ideation. He expresses suicidal plans.    Review of Systems  Psychiatric/Behavioral: Positive for depression, hallucinations, substance abuse and suicidal ideas. The patient is nervous/anxious and has insomnia.   All other systems reviewed and are negative.   There were no vitals taken for this visit.There is no height or weight on file to calculate BMI.  General Appearance: Casual  Eye Contact:  Good  Speech:  Clear and Coherent  Volume:  Normal  Mood:  Depressed  Affect:  Congruent  Thought Process:  Goal Directed  Orientation:  Full (Time, Place, and Person)  Thought Content:  Logical and Hallucinations: Auditory  Suicidal Thoughts:  Yes.  with intent/plan  Homicidal Thoughts:  No  Memory:  Immediate;   Good  Judgement:  Impaired  Insight:  Lacking  Psychomotor Activity:  Negative  Concentration: Concentration: Good  Recall:  Good  Fund of Knowledge:Fair  Language: Good  Akathisia:  Negative  Handed:  Right  AIMS (if indicated):     Assets:  Desire for Improvement  Sleep:       Musculoskeletal:  Strength & Muscle Tone: within normal limits Gait & Station: normal Patient leans: N/A  There were no vitals taken for this visit.  Recommendations:  Based on my evaluation the patient appears to have an emergency medical condition for which I recommend the patient be transferred to the emergency department for further evaluation.  Kerry Hough, PA-C 12/22/2016, 10:29 PM

## 2016-12-22 NOTE — Congregational Nurse Program (Signed)
Congregational Nurse Program Note  Date of Encounter: 12/22/2016  Past Medical History: Past Medical History:  Diagnosis Date  . Hypertension     Encounter Details: CNP Questionnaire - 12/22/16 1441      Questionnaire   Patient Status  Not Applicable    Race  Black or African American    Location Patient Served At  Not Applicable    Uninsured  Uninsured (NEW 1x/quarter)    Food  Yes, have food insecurities;Within past 12 months, worried food would run out with no money to buy more;Within past 12 months, food ran out with no money to buy more    Housing/Utilities  No permanent housing    Transportation  Yes, need transportation assistance    Interpersonal Safety  Yes, feel physically and emotionally safe where you currently live    Medication  Yes, have medication insecurities    Medical Provider  Yes    Referrals  Emergency Department    ED Visit Averted  Not Applicable    Life-Saving Intervention Made  Not Applicable      C/o of head ache and stomach ache.  Requested B/P to be taken.  160/110.  Pulse rate 123.  States in general does not feel "right".  Instructed client that he needed to be seen by a HCP.  Awaiting transportation from a friend to go the ED.  Once client has been seen we will work with him to obtain a HCP and obtain medications
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
## 2016-12-23 ENCOUNTER — Encounter (HOSPITAL_COMMUNITY): Payer: Self-pay

## 2016-12-23 ENCOUNTER — Other Ambulatory Visit: Payer: Self-pay

## 2016-12-23 ENCOUNTER — Emergency Department (HOSPITAL_COMMUNITY)
Admission: EM | Admit: 2016-12-23 | Discharge: 2016-12-23 | Disposition: A | Discharge status: Other Acute Inpatient Hospital | Payer: Self-pay | Attending: Emergency Medicine | Admitting: Emergency Medicine

## 2016-12-23 ENCOUNTER — Inpatient Hospital Stay (HOSPITAL_COMMUNITY)
Admission: AD | Admit: 2016-12-23 | Discharge: 2016-12-26 | DRG: 885 | Disposition: A | Discharge status: Home / Self Care | Payer: Federal, State, Local not specified - Other | Source: Intra-hospital | Attending: Psychiatry | Admitting: Psychiatry

## 2016-12-23 DIAGNOSIS — G4700 Insomnia, unspecified: Secondary | ICD-10-CM | POA: Diagnosis present

## 2016-12-23 DIAGNOSIS — I10 Essential (primary) hypertension: Secondary | ICD-10-CM | POA: Diagnosis present

## 2016-12-23 DIAGNOSIS — F209 Schizophrenia, unspecified: Secondary | ICD-10-CM | POA: Diagnosis present

## 2016-12-23 DIAGNOSIS — Z634 Disappearance and death of family member: Secondary | ICD-10-CM

## 2016-12-23 DIAGNOSIS — Z915 Personal history of self-harm: Secondary | ICD-10-CM

## 2016-12-23 DIAGNOSIS — R45851 Suicidal ideations: Secondary | ICD-10-CM | POA: Diagnosis present

## 2016-12-23 DIAGNOSIS — F1220 Cannabis dependence, uncomplicated: Secondary | ICD-10-CM

## 2016-12-23 DIAGNOSIS — F10239 Alcohol dependence with withdrawal, unspecified: Secondary | ICD-10-CM | POA: Diagnosis present

## 2016-12-23 DIAGNOSIS — R5383 Other fatigue: Secondary | ICD-10-CM

## 2016-12-23 DIAGNOSIS — F1020 Alcohol dependence, uncomplicated: Secondary | ICD-10-CM

## 2016-12-23 DIAGNOSIS — F419 Anxiety disorder, unspecified: Secondary | ICD-10-CM

## 2016-12-23 DIAGNOSIS — Z23 Encounter for immunization: Secondary | ICD-10-CM

## 2016-12-23 DIAGNOSIS — F4310 Post-traumatic stress disorder, unspecified: Secondary | ICD-10-CM | POA: Diagnosis present

## 2016-12-23 DIAGNOSIS — R440 Auditory hallucinations: Secondary | ICD-10-CM

## 2016-12-23 DIAGNOSIS — F17210 Nicotine dependence, cigarettes, uncomplicated: Secondary | ICD-10-CM | POA: Diagnosis present

## 2016-12-23 DIAGNOSIS — Z881 Allergy status to other antibiotic agents status: Secondary | ICD-10-CM

## 2016-12-23 DIAGNOSIS — Z630 Problems in relationship with spouse or partner: Secondary | ICD-10-CM

## 2016-12-23 DIAGNOSIS — F1994 Other psychoactive substance use, unspecified with psychoactive substance-induced mood disorder: Secondary | ICD-10-CM | POA: Diagnosis present

## 2016-12-23 DIAGNOSIS — R4584 Anhedonia: Secondary | ICD-10-CM

## 2016-12-23 DIAGNOSIS — F1910 Other psychoactive substance abuse, uncomplicated: Secondary | ICD-10-CM

## 2016-12-23 DIAGNOSIS — R4582 Worries: Secondary | ICD-10-CM

## 2016-12-23 LAB — RAPID URINE DRUG SCREEN, HOSP PERFORMED
Amphetamines: NOT DETECTED
BARBITURATES: NOT DETECTED
Benzodiazepines: NOT DETECTED
Cocaine: POSITIVE — AB
OPIATES: NOT DETECTED
TETRAHYDROCANNABINOL: POSITIVE — AB

## 2016-12-23 LAB — COMPREHENSIVE METABOLIC PANEL
ALK PHOS: 67 U/L (ref 38–126)
ALT: 24 U/L (ref 17–63)
ANION GAP: 10 (ref 5–15)
AST: 33 U/L (ref 15–41)
Albumin: 4.2 g/dL (ref 3.5–5.0)
BILIRUBIN TOTAL: 0.9 mg/dL (ref 0.3–1.2)
BUN: 13 mg/dL (ref 6–20)
CALCIUM: 9 mg/dL (ref 8.9–10.3)
CO2: 25 mmol/L (ref 22–32)
CREATININE: 0.95 mg/dL (ref 0.61–1.24)
Chloride: 101 mmol/L (ref 101–111)
GFR calc non Af Amer: >60 mL/min (ref >60)
Glucose, Bld: 116 mg/dL — ABNORMAL HIGH (ref 65–99)
Potassium: 3.3 mmol/L — ABNORMAL LOW (ref 3.5–5.1)
SODIUM: 136 mmol/L (ref 135–145)
TOTAL PROTEIN: 7.2 g/dL (ref 6.5–8.1)

## 2016-12-23 LAB — LIPID PANEL
Cholesterol: 171 mg/dL (ref 0–200)
HDL: 64 mg/dL (ref >40)
LDL Cholesterol: 85 mg/dL (ref 0–99)
Total CHOL/HDL Ratio: 2.7 RATIO
Triglycerides: 111 mg/dL (ref <150)
VLDL: 22 mg/dL (ref 0–40)

## 2016-12-23 LAB — CBC
HEMATOCRIT: 42.5 % (ref 39.0–52.0)
HEMOGLOBIN: 14.8 g/dL (ref 13.0–17.0)
MCH: 28.9 pg (ref 26.0–34.0)
MCHC: 34.8 g/dL (ref 30.0–36.0)
MCV: 83 fL (ref 78.0–100.0)
Platelets: 231 10*3/uL (ref 150–400)
RBC: 5.12 MIL/uL (ref 4.22–5.81)
RDW: 16 % — AB (ref 11.5–15.5)
WBC: 9.3 10*3/uL (ref 4.0–10.5)

## 2016-12-23 LAB — SALICYLATE LEVEL

## 2016-12-23 LAB — ACETAMINOPHEN LEVEL

## 2016-12-23 LAB — HEMOGLOBIN A1C
HEMOGLOBIN A1C: 6.3 % — AB (ref 4.8–5.6)
MEAN PLASMA GLUCOSE: 134.11 mg/dL

## 2016-12-23 LAB — ETHANOL: Alcohol, Ethyl (B): <10 mg/dL (ref <10)

## 2016-12-23 LAB — TSH: TSH: 0.651 u[IU]/mL (ref 0.350–4.500)

## 2016-12-23 MED ORDER — POTASSIUM CHLORIDE CRYS ER 20 MEQ PO TBCR
40.0000 meq | EXTENDED_RELEASE_TABLET | Freq: Once | ORAL | Status: AC
  Administered 2016-12-23: 40 meq via ORAL
  Filled 2016-12-23: qty 2

## 2016-12-23 MED ORDER — TRAZODONE HCL 50 MG PO TABS
50.0000 mg | ORAL_TABLET | Freq: Every evening | ORAL | Status: DC | PRN
  Filled 2016-12-23 (×10): qty 1

## 2016-12-23 MED ORDER — SERTRALINE HCL 50 MG PO TABS
50.0000 mg | ORAL_TABLET | Freq: Every day | ORAL | Status: DC
  Administered 2016-12-23 – 2016-12-26 (×4): 50 mg via ORAL
  Filled 2016-12-23: qty 21
  Filled 2016-12-23 (×6): qty 1

## 2016-12-23 MED ORDER — ALUM & MAG HYDROXIDE-SIMETH 200-200-20 MG/5ML PO SUSP: 30.0000 mL | ORAL | Status: DC | PRN

## 2016-12-23 MED ORDER — INFLUENZA VAC SPLIT QUAD 0.5 ML IM SUSY
0.5000 mL | PREFILLED_SYRINGE | INTRAMUSCULAR | Status: AC
  Administered 2016-12-24: 0.5 mL via INTRAMUSCULAR
  Filled 2016-12-23: qty 0.5

## 2016-12-23 MED ORDER — LOPERAMIDE HCL 2 MG PO CAPS: 2.0000 mg | ORAL_CAPSULE | ORAL | Status: AC | PRN

## 2016-12-23 MED ORDER — CHLORDIAZEPOXIDE HCL 25 MG PO CAPS
25.0000 mg | ORAL_CAPSULE | Freq: Once | ORAL | Status: AC
  Administered 2016-12-23: 25 mg via ORAL
  Filled 2016-12-23: qty 1

## 2016-12-23 MED ORDER — CHLORDIAZEPOXIDE HCL 25 MG PO CAPS
25.0000 mg | ORAL_CAPSULE | Freq: Four times a day (QID) | ORAL | Status: DC | PRN
  Administered 2016-12-24: 25 mg via ORAL
  Filled 2016-12-23 (×2): qty 1

## 2016-12-23 MED ORDER — ENSURE ENLIVE PO LIQD: 237.0000 mL | ORAL | Status: DC

## 2016-12-23 MED ORDER — HYDROCHLOROTHIAZIDE 25 MG PO TABS
12.5000 mg | ORAL_TABLET | Freq: Every day | ORAL | Status: DC
  Administered 2016-12-23 – 2016-12-24 (×2): 12.5 mg via ORAL
  Filled 2016-12-23: qty 0.5
  Filled 2016-12-23: qty 1
  Filled 2016-12-23 (×3): qty 0.5

## 2016-12-23 MED ORDER — MAGNESIUM HYDROXIDE 400 MG/5ML PO SUSP: 30.0000 mL | Freq: Every day | ORAL | Status: DC | PRN

## 2016-12-23 MED ORDER — HYDROXYZINE HCL 25 MG PO TABS: 25.0000 mg | ORAL_TABLET | Freq: Four times a day (QID) | ORAL | Status: DC | PRN

## 2016-12-23 MED ORDER — ONDANSETRON 4 MG PO TBDP
4.0000 mg | ORAL_TABLET | Freq: Four times a day (QID) | ORAL | Status: DC | PRN
  Administered 2016-12-23: 4 mg via ORAL
  Filled 2016-12-23: qty 1

## 2016-12-23 MED ORDER — ACETAMINOPHEN 325 MG PO TABS
650.0000 mg | ORAL_TABLET | Freq: Four times a day (QID) | ORAL | Status: DC | PRN
  Administered 2016-12-24: 650 mg via ORAL
  Filled 2016-12-23: qty 2

## 2016-12-23 MED ORDER — PNEUMOCOCCAL VAC POLYVALENT 25 MCG/0.5ML IJ INJ
0.5000 mL | INJECTION | INTRAMUSCULAR | Status: AC
  Administered 2016-12-24: 0.5 mL via INTRAMUSCULAR

## 2016-12-23 MED ORDER — VITAMIN B-1 100 MG PO TABS
100.0000 mg | ORAL_TABLET | Freq: Every day | ORAL | Status: DC
  Administered 2016-12-24: 100 mg via ORAL
  Filled 2016-12-23 (×2): qty 1

## 2016-12-23 MED ORDER — ARIPIPRAZOLE 10 MG PO TABS
10.0000 mg | ORAL_TABLET | Freq: Every day | ORAL | Status: DC
  Administered 2016-12-23 – 2016-12-26 (×4): 10 mg via ORAL
  Filled 2016-12-23: qty 21
  Filled 2016-12-23 (×5): qty 1

## 2016-12-23 MED ORDER — ADULT MULTIVITAMIN W/MINERALS CH
1.0000 | ORAL_TABLET | Freq: Every day | ORAL | Status: DC
  Administered 2016-12-23 – 2016-12-24 (×2): 1 via ORAL
  Filled 2016-12-23 (×3): qty 1

## 2016-12-23 MED ORDER — THIAMINE HCL 100 MG/ML IJ SOLN: 100.0000 mg | Freq: Once | INTRAMUSCULAR | Status: DC

## 2016-12-23 NOTE — ED Notes (Signed)
All belonging [facility] to [facility].

## 2016-12-23 NOTE — Progress Notes (Signed)
NUTRITION ASSESSMENT  Pt identified as at risk on the Malnutrition Screen Tool  INTERVENTION: 1. Educated patient on the importance of nutrition and encouraged intake of food and beverages. 2. Discussed weight goals. 3. Supplements: will order Ensure Enlive once/day, this supplement provides 350 kcal and 20 grams of protein   NUTRITION DIAGNOSIS: Unintentional weight loss related to sub-optimal intake as evidenced by pt report.   Goal: Pt to meet >/= 90% of their estimated nutrition needs.  Monitor:  PO intake  Assessment:  Pt admitted for worsening depression with SI with plan to cut his wrists. Notes indicate that pt has had SA in the past by also trying to cut his wrists. Per notes, pt's sister died in March 2018. Pt has lost 33 lbs (14% body weight) in the past 7.5 months; this is significant for time frame.   Continue to encourage PO intakes of meals, supplement, and snacks.     46 y.o. male  Height: Ht Readings from Last 1 Encounters:  12/23/16 5\' 9"  (1.753 m)    Weight: Wt Readings from Last 1 Encounters:  12/23/16 204 lb (92.5 kg)    Weight Hx: Wt Readings from Last 10 Encounters:  12/23/16 204 lb (92.5 kg)  12/22/16 210 lb (95.3 kg)  11/04/16 204 lb (92.5 kg)  05/08/16 237 lb (107.5 kg)    BMI:  Body mass index is 30.13 kg/m. Pt meets criteria for obesity based on current BMI.  Estimated Nutritional Needs: Kcal: 25-30 kcal/kg Protein: > 1 gram protein/kg Fluid: 1 ml/kcal  Diet Order: Diet regular Room service appropriate? Yes; Fluid consistency: Thin Pt is also offered choice of unit snacks mid-morning and mid-afternoon.  Pt is eating as desired.   Lab results and medications reviewed.      Trenton GammonJessica Alyus Mofield, MS, RD, LDN, [facility] Inpatient Clinical Dietitian Pager # [phone] After hours/weekend pager # [phone]

## 2016-12-23 NOTE — Progress Notes (Signed)
Pt did not attend wrap-up group, remained in room sleep. 

## 2016-12-23 NOTE — ED Notes (Signed)
Pt given a Ginger Ale and sandwich per request.

## 2016-12-23 NOTE — Progress Notes (Signed)
Recreation Therapy Notes  INPATIENT RECREATION THERAPY ASSESSMENT  Patient Details Name: Matthew Kaufman MRN: [id number] DOB: 11/02/1970 Today's Date: 12/23/2016  Patient Stressors: Relationship  Pt stated he was here for suicidal thoughts.  Coping Skills:   Isolate, Substance Abuse, Avoidance, Exercise, Talking, Music, Sports  Personal Challenges: Anger, Concentration, Decision-Making, Problem-Solving, Relationships, Self-Esteem/Confidence, Stress Management, Substance Abuse  Leisure Interests (2+):  Individual - TV, Exercise - Walking, Community - Travel (Comment), Social - Friends  LawyerAwareness of Community Resources:  Yes  Community Resources:  [employer], [place]  Current Use: Yes  Patient Strengths:  Communicating; listening; work ethic  Patient Identified Areas of Improvement:  Getting control of my life; Drugs/alcohol  Current Recreation Participation:  Not much  Patient Goal for Hospitalization:  "Get closer to God; get mental, physically and spiritually stronger"  [place]  [place] Residence:  [place]  Current SI (including self-harm):  No  Current HI:  No  Consent to Intern Participation: N/A   Caroll RancherMarjette Mujahid Jalomo, LRT/CTRS  Caroll RancherLindsay, Taquilla Downum A 12/23/2016, 12:19 PM

## 2016-12-23 NOTE — Progress Notes (Signed)
Recreation Therapy Notes  Date: 12/23/16 Time: 0940 Location: [address] Dayroom  Group Topic: Self-Esteem  Goal Area(s) Addresses:  Patient will successfully identify positive attributes about themselves.  Patient will successfully identify benefit of improved self-esteem.   Intervention: Worksheet  Activity: Self-Exploration.  Patients were given worksheets with eleven sentences in which they had to fill in the blank to complete.  Patients were to identify things such as who they admire, how they feel, what they are struggling with, what makes them proud of themselves, etc.  Education:  Self-Esteem, Discharge Planning.   Education Outcome: Acknowledges education/In group clarification offered/Needs additional education  Clinical Observations/Feedback: Pt did not attend group.   Jenia Klepper, LRT/CTRS         Manasseh Pittsley A 12/23/2016 11:47 AM 

## 2016-12-23 NOTE — ED Provider Notes (Signed)
[facility]-EMERGENCY DEPT Provider Note   CSN: [id number] Arrival date & time: 12/22/16  2108     History   Chief Complaint Chief Complaint  Patient presents with  . Medical Clearance    HPI Matthew Kaufman is a 46 y.o. male.  The history is provided by the patient.  He has history of paranoid schizophrenia and hypertension, was referred here from [facility] for medical clearance.  He relates that he has been having thoughts racing through his head including suicidal thoughts of cutting his wrist.  He does have a history of suicide attempt by cutting his wrist.  He denies hearing any voices.  He denies fever or chills.  Denies cough.  Denies nausea, vomiting, diarrhea.  He is complaining of the tips of his fingers aching, but no other arthralgias or myalgias.  He is a cigarette smoker and a heavy drinker and admits to using marijuana and cocaine.  Past Medical History:  Diagnosis Date  . Hypertension     Patient Active Problem List   Diagnosis Date Noted  . HTN (hypertension) 05/08/2016  . Depression with anxiety 05/08/2016  . Insomnia 05/08/2016  . Substance abuse (HCC) 05/08/2016  . Cracked tooth 05/08/2016  . Acne 05/08/2016    Past Surgical History:  Procedure Laterality Date  . APPENDECTOMY         Home Medications    Prior to Admission medications   Medication Sig Start Date End Date Taking? Authorizing Provider  hydrochlorothiazide (HYDRODIURIL) 12.5 MG tablet Take 1 tablet (12.5 mg total) by mouth daily. 05/08/16   Loletta SpecterGomez, Roger Armondo Cech, PA-C  Melatonin 5 MG TABS Take 1 tablet (5 mg total) by mouth at bedtime. 05/08/16   Loletta SpecterGomez, Roger Rishon Thilges, PA-C  Salicylic Acid 2 % PADS Apply 1 application topically 2 (two) times daily. 05/08/16   Loletta SpecterGomez, Roger Kaleea Penner, PA-C  sertraline (ZOLOFT) 50 MG tablet Take 1 tablet (50 mg total) by mouth daily. 05/08/16   Loletta SpecterGomez, Roger Teresa Lemmerman, PA-C    Family History History reviewed. No pertinent family  history.  Social History Social History   Tobacco Use  . Smoking status: Current Every Day Smoker  . Smokeless tobacco: Never Used  Substance Use Topics  . Alcohol use: Yes  . Drug use: Yes    Types: Marijuana     Allergies   Amoxicillin and Doxycycline   Review of Systems Review of Systems  All other systems reviewed and are negative.    Physical Exam Updated Vital Signs BP (!) 134/92 (BP Location: Left Arm)   Pulse 70   Temp 98.4 F (36.9 C) (Oral)   Resp 18   Wt 95.3 kg (210 lb)   SpO2 99%   BMI 31.01 kg/m   Physical Exam  Nursing note and vitals reviewed.  46 year old male, resting comfortably and in no acute distress. Vital signs are significant for borderline hypertension. Oxygen saturation is 99%, which is normal. Head is normocephalic and atraumatic. PERRLA, EOMI. Oropharynx is clear. Neck is nontender and supple without adenopathy or JVD. Back is nontender and there is no CVA tenderness. Lungs are clear without rales, wheezes, or rhonchi. Chest is nontender. Heart has regular rate and rhythm without murmur. Abdomen is soft, flat, nontender without masses or hepatosplenomegaly and peristalsis is normoactive. Extremities have no cyanosis or edema, full range of motion is present. Skin is warm and dry without rash. Neurologic: Mental status is normal, cranial nerves are intact, there are no motor or sensory  deficits.  ED Treatments / Results  Labs (all labs ordered are listed, but only abnormal results are displayed) Labs Reviewed  COMPREHENSIVE METABOLIC PANEL - Abnormal; Notable for the following components:      Result Value   Potassium 3.3 (*)    Glucose, Bld 116 (*)    All other components within normal limits  ACETAMINOPHEN LEVEL - Abnormal; Notable for the following components:   Acetaminophen (Tylenol), Serum <10 (*)    All other components within normal limits  CBC - Abnormal; Notable for the following components:   RDW 16.0 (*)    All  other components within normal limits  RAPID URINE DRUG SCREEN, HOSP PERFORMED - Abnormal; Notable for the following components:   Cocaine POSITIVE (*)    Tetrahydrocannabinol POSITIVE (*)    All other components within normal limits  ETHANOL  SALICYLATE LEVEL   Procedures Procedures (including critical care time)  Medications Ordered in ED Medications  potassium chloride SA (K-DUR,KLOR-CON) CR tablet 40 mEq (not administered)     Initial Impression / Assessment and Plan / ED Course  I have reviewed the triage vital signs and the nursing notes.  Pertinent lab results that were available during my care of the patient were reviewed by me and considered in my medical decision making (see chart for details).  Suicidal ideation and patient with unknown psychiatric history and prior suicide attempts.  Old records are reviewed showing that he had been evaluated at behavioral health Hospital earlier this evening with admission recommended.  Screening labs are obtained.  Laboratory workup shows mild hypokalemia.  He is given a dose of oral potassium.  He is transferred to behavioral health Hospital for psychiatric treatment.  Final Clinical Impressions(s) / ED Diagnoses   Final diagnoses:  Suicidal ideation  Polysubstance abuse [facility])    ED Discharge Orders    None       Dione BoozeGlick, Zaniel Marineau, MD 12/23/16 [phone]

## 2016-12-23 NOTE — Tx Team (Signed)
Initial Treatment Plan 12/23/2016 6:47 AM Matthew Kaufman [id number]    PATIENT STRESSORS: Financial difficulties Loss of housing Medication change or noncompliance Substance abuse   PATIENT STRENGTHS: Ability for insight Average or above average intelligence Communication skills Motivation for treatment/growth Supportive family/friends   PATIENT IDENTIFIED PROBLEMS: Substance use  Depression  Psychosis   Suicidal  Ideation  "I want to get back on my meds"  "find a place, like a 90 day treatment program"           DISCHARGE CRITERIA:  Ability to meet basic life and health needs Improved stabilization in mood, thinking, and/or behavior Motivation to continue treatment in a less acute level of care Safe-care adequate arrangements made Verbal commitment to aftercare and medication compliance  PRELIMINARY DISCHARGE PLAN: Attend PHP/IOP  PATIENT/FAMILY INVOLVEMENT: This treatment plan has been presented to and reviewed with the patient, Matthew Kaufman .The patient and family have been given the opportunity to ask questions and make suggestions.  [name], Lakesha Levinson E, RN 12/23/2016, 6:47 AM

## 2016-12-23 NOTE — H&P (Signed)
Psychiatric Admission Assessment Adult  Patient Identification: Matthew Kaufman MRN:  [id number] Date of Evaluation:  12/23/2016 Chief Complaint:  MDD recurrent severe  alcohol use disorder  PTSD Principal Diagnosis: Schizophrenia ([place]) Diagnosis:   Patient Active Problem List   Diagnosis Date Noted  . Substance induced mood disorder ([place]) [F19.94] 12/23/2016  . Schizophrenia ([place]) [F20.9] 12/23/2016  . Alcohol use disorder, severe, dependence ([place]) [F10.20] 12/23/2016  . HTN (hypertension) [I10] 05/08/2016  . Depression with anxiety [F41.8] 05/08/2016  . Insomnia [G47.00] 05/08/2016  . Substance abuse ([place]) [F19.10] 05/08/2016  . Cracked tooth [K03.81] 05/08/2016  . Acne [L70.9] 05/08/2016   History of Present Illness:  Matthew Kaufman is a 46 y/o M with history of schizophrenia who presented voluntarily with worsening depression, SI with plan to cut his wrist, anxiety, and substance use of multiple illicit substances. Pt shares his reasons for presenting to [facility], stating, "I was at the pont of no return - I never cut my wrist before." Pt reports depressive symptoms of anhedonia, guilty feelings, low energy, poor concentration, poor appetite, low motivation, and hopelessness. He denies symptoms of OCD, mania, and PTSD. He endorses SI with plan to cut his wrist. He denies HI. He endorses CAH which told him that people are after him and to cut himself to "release the pain." He denies VH. Pt reports that has stressors of strained relationship with his girlfriend with whom he lives. Pt moved to this area from [place] about 7 months ago after the death of his sister, and he has remained in the area. He started a relationship with a woman and moved in with her, but pt reports that he would like to end the relationship because his girlfriend has been a bad influence in terms of substance use. Pt reports that he has been using cannabis daily, cocaine whenever it is available, and alcohol about 2 fifths  of wine daily and 6 beers. Pt has also tried ecstasy when it is available.   Pt would like to stop using substances and seek treatment for his problem with dependency. He states, "I think a 90-day program or something like that would be best for me." Pt reports he has attempted multiple trials of psychotropic medications before but currently he has is not taking any medications. He reports previous trials of geodon, risperdal (caused weight gain and sedation), prozac, paxil, and zoloft. He recalls that zoloft was helpful at one point. He has never attempted a trial of abilify before and he is in agreement to attempt a trial. Pt agrees to meet with SW staff to discuss substance use treatment options. He indicates that he is motivated to seek treatment and maintain his sobriety, and he emphasizes that he would like to find a program that will help him create boundaries between his current social contacts as he feels they provide access to illicit substance use. Discussed with patient that we will also start CIWA protocol with librium to address his symptoms of withdrawal. Pt had no further questions, comments, or concerns.  Associated Signs/Symptoms: Depression Symptoms:  depressed mood, anhedonia, feelings of worthlessness/guilt, hopelessness, suicidal thoughts with specific plan, anxiety, loss of energy/fatigue, disturbed sleep, (Hypo) Manic Symptoms:  pt denies Anxiety Symptoms:  Excessive Worry, Psychotic Symptoms:  Hallucinations: Auditory Command:  to hurt self Paranoia, PTSD Symptoms: NA Total Time spent with patient: 45 minutes  Past Psychiatric History:  - Previous diagnosis of schizophrenia - Pt reports previous inpatient hospitalization in [place] - He has no current outpatient provider  or therapist - He takes no current psychotropic medications - He has previous suicide attempt via cutting wrist more than 5 years ago  Is the patient at risk to self? Yes.    Has the patient  been a risk to self in the past 6 months? Yes.    Has the patient been a risk to self within the distant past? Yes.    Is the patient a risk to others? No.  Has the patient been a risk to others in the past 6 months? No.  Has the patient been a risk to others within the distant past? No.   Prior Inpatient Therapy:   Prior Outpatient Therapy:    Alcohol Screening: 1. How often do you have a drink containing alcohol?: 4 or more times a week 2. How many drinks containing alcohol do you have on a typical day when you are drinking?: 5 or 6 3. How often do you have six or more drinks on one occasion?: Daily or almost daily AUDIT-C Score: 10 4. How often during the last year have you found that you were not able to stop drinking once you had started?: Weekly 5. How often during the last year have you failed to do what was normally expected from you becasue of drinking?: Never 6. How often during the last year have you needed a first drink in the morning to get yourself going after a heavy drinking session?: Monthly 7. How often during the last year have you had a feeling of guilt of remorse after drinking?: Weekly 8. How often during the last year have you been unable to remember what happened the night before because you had been drinking?: Weekly 9. Have you or someone else been injured as a result of your drinking?: No 10. Has a relative or friend or a doctor or another health worker been concerned about your drinking or suggested you cut down?: No Alcohol Use Disorder Identification Test Final Score (AUDIT): 21 Intervention/Follow-up: Patient Refused Substance Abuse History in the last 12 months:  Yes.   Consequences of Substance Abuse: worsened anxiety, loss of housing, financial difficulties Previous Psychotropic Medications: Yes  Psychological Evaluations: Yes  Past Medical History:  Past Medical History:  Diagnosis Date  . Hypertension     Past Surgical History:  Procedure  Laterality Date  . APPENDECTOMY     Family History: History reviewed. No pertinent family history. Family Psychiatric  History: - Pt denies family psychiatric history of history of suicide attempt in the family Tobacco Screening: Have you used any form of tobacco in the last 30 days? (Cigarettes, Smokeless Tobacco, Cigars, and/or Pipes): Yes Tobacco use, Select all that apply: 5 or more cigarettes per day Are you interested in Tobacco Cessation Medications?: No, patient refused Counseled patient on smoking cessation including recognizing danger situations, developing coping skills and basic information about quitting provided: Refused/Declined practical counseling Social History:  Social History   Substance and Sexual Activity  Alcohol Use Yes  . Alcohol/week: 3.6 oz  . Types: 6 Glasses of wine per week     Social History   Substance and Sexual Activity  Drug Use Yes  . Types: Marijuana, Cocaine    Additional Social History: Does patient have children?: No    Pain Medications: denies Prescriptions: denies Over the Counter: denies History of alcohol / drug use?: Yes Name of Substance 1: ETOH 1 - Age of First Use: 46 years of age 11 - Amount (size/oz): Two 5th of wine and  a 6 pack of beer daily. 1 - Frequency: Daily use 1 - Duration: on-going for the last 18 months 1 - Last Use / Amount: 11/05  Four beers Name of Substance 2: Marijuana 2 - Age of First Use: 46 years of age 79 - Amount (size/oz): At least a joint per day. 2 - Frequency: Daily use 2 - Duration: Last 18 months 2 - Last Use / Amount: 11/05 Name of Substance 3: Cocaine 3 - Age of First Use: Teens 3 - Amount (size/oz): Varies according to finances 3 - Frequency: Finances determines frequency 3 - Duration: on-going 3 - Last Use / Amount: 11/05              Allergies:   Allergies  Allergen Reactions  . Amoxicillin Hives    Has patient had a PCN reaction causing immediate rash, facial/tongue/throat  swelling, SOB or lightheadedness with hypotension: Yes Has patient had a PCN reaction causing severe rash involving mucus membranes or skin: yes Has patient had a PCN reaction that required hospitalization: No Has patient had a PCN reaction occurring within the last 10 years: No If all of the above answers are "NO", then may proceed with Cephalosporin use.   Marland Kitchen Doxycycline Hives   Lab Results:  Results for orders placed or performed during the hospital encounter of 12/23/16 (from the past 48 hour(s))  Rapid urine drug screen (hospital performed)     Status: Abnormal   Collection Time: 12/23/16  2:43 AM  Result Value Ref Range   Opiates NONE DETECTED NONE DETECTED   Cocaine POSITIVE (A) NONE DETECTED   Benzodiazepines NONE DETECTED NONE DETECTED   Amphetamines NONE DETECTED NONE DETECTED   Tetrahydrocannabinol POSITIVE (A) NONE DETECTED   Barbiturates NONE DETECTED NONE DETECTED    Comment:        DRUG SCREEN FOR MEDICAL PURPOSES ONLY.  IF CONFIRMATION IS NEEDED FOR ANY PURPOSE, NOTIFY LAB WITHIN 5 DAYS.        LOWEST DETECTABLE LIMITS FOR URINE DRUG SCREEN Drug Class       Cutoff (ng/mL) Amphetamine      1000 Barbiturate      200 Benzodiazepine   102 Tricyclics       585 Opiates          300 Cocaine          300 THC              50   Comprehensive metabolic panel     Status: Abnormal   Collection Time: 12/23/16  3:16 AM  Result Value Ref Range   Sodium 136 135 - 145 mmol/L   Potassium 3.3 (L) 3.5 - 5.1 mmol/L   Chloride 101 101 - 111 mmol/L   CO2 25 22 - 32 mmol/L   Glucose, Bld 116 (H) 65 - 99 mg/dL   BUN 13 6 - 20 mg/dL   Creatinine, Ser 0.95 0.61 - 1.24 mg/dL   Calcium 9.0 8.9 - 10.3 mg/dL   Total Protein 7.2 6.5 - 8.1 g/dL   Albumin 4.2 3.5 - 5.0 g/dL   AST 33 15 - 41 U/L   ALT 24 17 - 63 U/L   Alkaline Phosphatase 67 38 - 126 U/L   Total Bilirubin 0.9 0.3 - 1.2 mg/dL   GFR calc non Af Amer >60 >60 mL/min   GFR calc Af Amer >60 >60 mL/min    Comment:  (NOTE) The eGFR has been calculated using the CKD EPI equation. This calculation has  not been validated in all clinical situations. eGFR's persistently <60 mL/min signify possible Chronic Kidney Disease.    Anion gap 10 5 - 15  Ethanol     Status: None   Collection Time: 12/23/16  3:16 AM  Result Value Ref Range   Alcohol, Ethyl (B) <10 <10 mg/dL    Comment:        LOWEST DETECTABLE LIMIT FOR SERUM ALCOHOL IS 10 mg/dL FOR MEDICAL PURPOSES ONLY   Salicylate level     Status: None   Collection Time: 12/23/16  3:16 AM  Result Value Ref Range   Salicylate Lvl <1.6 2.8 - 30.0 mg/dL  Acetaminophen level     Status: Abnormal   Collection Time: 12/23/16  3:16 AM  Result Value Ref Range   Acetaminophen (Tylenol), Serum <10 (L) 10 - 30 ug/mL    Comment:        THERAPEUTIC CONCENTRATIONS VARY SIGNIFICANTLY. A RANGE OF 10-30 ug/mL MAY BE AN EFFECTIVE CONCENTRATION FOR MANY PATIENTS. HOWEVER, SOME ARE BEST TREATED AT CONCENTRATIONS OUTSIDE THIS RANGE. ACETAMINOPHEN CONCENTRATIONS >150 ug/mL AT 4 HOURS AFTER INGESTION AND >50 ug/mL AT 12 HOURS AFTER INGESTION ARE OFTEN ASSOCIATED WITH TOXIC REACTIONS.   cbc     Status: Abnormal   Collection Time: 12/23/16  3:16 AM  Result Value Ref Range   WBC 9.3 4.0 - 10.5 K/uL   RBC 5.12 4.22 - 5.81 MIL/uL   Hemoglobin 14.8 13.0 - 17.0 g/dL   HCT 42.5 39.0 - 52.0 %   MCV 83.0 78.0 - 100.0 fL   MCH 28.9 26.0 - 34.0 pg   MCHC 34.8 30.0 - 36.0 g/dL   RDW 16.0 (H) 11.5 - 15.5 %   Platelets 231 150 - 400 K/uL    Blood Alcohol level:  Lab Results  Component Value Date   ETH <10 10/96/0454    Metabolic Disorder Labs:  No results found for: HGBA1C, MPG No results found for: PROLACTIN Lab Results  Component Value Date   CHOL 199 05/08/2016   TRIG 141 05/08/2016   HDL 49 05/08/2016   CHOLHDL 4.1 05/08/2016   LDLCALC 122 (H) 05/08/2016    Current Medications: Current Facility-Administered Medications  Medication Dose Route Frequency  Provider Last Rate Last Dose  . acetaminophen (TYLENOL) tablet 650 mg  650 mg Oral Q6H PRN Laverle Hobby, PA-C      . alum & mag hydroxide-simeth (MAALOX/MYLANTA) 200-200-20 MG/5ML suspension 30 mL  30 mL Oral Q4H PRN Patriciaann Clan E, PA-C      . ARIPiprazole (ABILIFY) tablet 10 mg  10 mg Oral Daily Pennelope Bracken, MD   10 mg at 12/23/16 1352  . chlordiazePOXIDE (LIBRIUM) capsule 25 mg  25 mg Oral Q6H PRN Pennelope Bracken, MD      . feeding supplement (ENSURE ENLIVE) (ENSURE ENLIVE) liquid 237 mL  237 mL Oral Q24H Cobos, Myer Peer, MD      . hydrochlorothiazide (HYDRODIURIL) tablet 12.5 mg  12.5 mg Oral Daily Patriciaann Clan E, PA-C   12.5 mg at 12/23/16 1352  . hydrOXYzine (ATARAX/VISTARIL) tablet 25 mg  25 mg Oral Q6H PRN Laverle Hobby, PA-C      . [START ON 12/24/2016] Influenza vac split quadrivalent PF (FLUARIX) injection 0.5 mL  0.5 mL Intramuscular Tomorrow-1000 Simon, Spencer E, PA-C      . loperamide (IMODIUM) capsule 2-4 mg  2-4 mg Oral PRN Pennelope Bracken, MD      . magnesium hydroxide (MILK OF MAGNESIA) suspension  30 mL  30 mL Oral Daily PRN Laverle Hobby, PA-C      . multivitamin with minerals tablet 1 tablet  1 tablet Oral Daily Pennelope Bracken, MD   1 tablet at 12/23/16 1352  . ondansetron (ZOFRAN-ODT) disintegrating tablet 4 mg  4 mg Oral Q6H PRN Pennelope Bracken, MD      . Derrill Memo ON 12/24/2016] pneumococcal 23 valent vaccine (PNU-IMMUNE) injection 0.5 mL  0.5 mL Intramuscular Tomorrow-1000 Simon, Spencer E, PA-C      . sertraline (ZOLOFT) tablet 50 mg  50 mg Oral Daily Patriciaann Clan E, PA-C   50 mg at 12/23/16 1352  . thiamine (B-1) injection 100 mg  100 mg Intramuscular Once Pennelope Bracken, MD      . Derrill Memo ON 12/24/2016] thiamine (VITAMIN B-1) tablet 100 mg  100 mg Oral Daily Pennelope Bracken, MD      . traZODone (DESYREL) tablet 50 mg  50 mg Oral QHS,MR X 1 Simon, Spencer E, PA-C       PTA  Medications: Medications Prior to Admission  Medication Sig Dispense Refill Last Dose  . hydrochlorothiazide (HYDRODIURIL) 12.5 MG tablet Take 1 tablet (12.5 mg total) by mouth daily. 90 tablet 3 unknown  . Melatonin 5 MG TABS Take 1 tablet (5 mg total) by mouth at bedtime. (Patient not taking: Reported on 12/23/2016) 30 tablet 2 Not Taking at Unknown time  . Salicylic Acid 2 % PADS Apply 1 application topically 2 (two) times daily. (Patient not taking: Reported on 12/23/2016) 60 each 0 Not Taking at Unknown time  . sertraline (ZOLOFT) 50 MG tablet Take 1 tablet (50 mg total) by mouth daily. 30 tablet 3 unknown    Musculoskeletal: Strength & Muscle Tone: within normal limits Gait & Station: normal Patient leans: N/A  Psychiatric Specialty Exam: Physical Exam  Nursing note and vitals reviewed.   Review of Systems  Constitutional: Negative for chills and fever.  Respiratory: Negative for cough and shortness of breath.   Cardiovascular: Negative for chest pain.  Gastrointestinal: Negative for abdominal pain, heartburn, nausea and vomiting.  Psychiatric/Behavioral: Positive for depression, hallucinations and suicidal ideas.    Blood pressure 127/81, pulse 68, temperature (!) 97.5 F (36.4 C), temperature source Oral, resp. rate 16, height 5' 9"  (1.753 m), weight 92.5 kg (204 lb).Body mass index is 30.13 kg/m.  General Appearance: Casual and Fairly Groomed  Eye Contact:  Good  Speech:  Clear and Coherent and Normal Rate  Volume:  Normal  Mood:  Anxious  Affect:  Congruent and Constricted  Thought Process:  Coherent and Goal Directed  Orientation:  Full (Time, Place, and Person)  Thought Content:  Hallucinations: Auditory  Suicidal Thoughts:  Yes.  with intent/plan  Homicidal Thoughts:  No  Memory:  Immediate;   Good Recent;   Good Remote;   Good  Judgement:  Fair  Insight:  Fair  Psychomotor Activity:  Normal  Concentration:  Concentration: Good  Recall:  [place] of  Knowledge:  Fair  Language:  Fair  Akathisia:  No  Handed:    AIMS (if indicated):     Assets:  Communication Skills Desire for Improvement Physical Health Resilience Social Support  ADL's:  Intact  Cognition:  WNL  Sleep:       Treatment Plan Summary: Daily contact with patient to assess and evaluate symptoms and progress in treatment and Medication management  Observation Level/Precautions:  15 minute checks  Laboratory:  CBC HbAIC UDS, lipid panel,  CMP, prolactin, HgbA1c, TSH  Psychotherapy:  Encourage participation in groups and therapeutic milieu  Medications:  Start zoloft 32m qDay, start abilify 186mqDay, start CIWA protocol with librium for withdrawal  Consultations:    Discharge Concerns:    Estimated LOS: 3-5 days  Other:     Physician Treatment Plan for Primary Diagnosis: Schizophrenia (HCK[place]ina Village[place] Term Goal(s): Improvement in symptoms so as ready for discharge  Short Term Goals: Ability to identify changes in lifestyle to reduce recurrence of condition will improve and Compliance with prescribed medications will improve  Physician Treatment Plan for Secondary Diagnosis: Principal Problem:   Schizophrenia (HCFranklintonActive Problems:   Alcohol use disorder, severe, dependence (HCG[place] Long Term Goal(s): Improvement in symptoms so as ready for discharge  Short Term Goals: Ability to identify changes in lifestyle to reduce recurrence of condition will improve, Ability to demonstrate self-control will improve, Ability to identify and develop effective coping behaviors will improve and Compliance with prescribed medications will improve  I certify that inpatient services furnished can reasonably be expected to improve the patient's condition.    ChPennelope BrackenMD 11/6/20184:43 PM

## 2016-12-23 NOTE — Progress Notes (Addendum)
Patient ID: Matthew AlmasDonald Knoff, male   DOB: 11/19/1970, [age] y.o.   MRN: [id number]   46 year [facility]. Pt complains of worsening substance use concurrent with a worsening romantic relationship, increased stress and financial concerns. Pt past medical history includes HTN and patient reports on going back, finger and shoulder pain. Pt has allergies to Amoxicillin and Doxycycline. Patient has not been on medications for a year due to financial hardships. 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   Pt reports using marijuana and alcohol daily and occasionally using cocaine. Pt reports a recent weight loss of approximately 30 lbs. Pt currently denies SI/HI and endorses ongoing A/V hallucinations of voices and blurs. Pt was also reporting paranoid delusions during initial assessment and the ED MD's note states that patient has a history of schizophrenia.   Pt verbally agrees to seek staff if SI/HI or A/VH occurs and to consult with staff before acting on any harmful thoughts. When asked goals for his admission, pt states "to get back on my medications" and "go to a place, like a 90 day treatment program" at discharge. Consents signed, skin/belongings search completed and pt oriented to unit. Pt stable at this time. Pt given the opportunity to express concerns and ask questions. Pt given toiletries and will order a breakfast tray this morning. Accepting RN given report.

## 2016-12-23 NOTE — [facility] Suicide Risk Assessment (Signed)
[facility]   Nursing information obtained from:    Demographic factors:    Current Mental Status:    Loss Factors:    Historical Factors:    Risk Reduction Factors:     Total Time spent with patient: 45 minutes Principal Problem: Schizophrenia (HCC) Diagnosis:   Patient Active Problem List   Diagnosis Date Noted  . Substance induced mood disorder (HCC) [F19.94] 12/23/2016  . Schizophrenia (HCC) [F20.9] 12/23/2016  . Alcohol use disorder, severe, dependence (HCC) [F10.20] 12/23/2016  . HTN (hypertension) [I10] 05/08/2016  . Depression with anxiety [F41.8] 05/08/2016  . Insomnia [G47.00] 05/08/2016  . Substance abuse (HCC) [F19.10] 05/08/2016  . Cracked tooth [K03.81] 05/08/2016  . Acne [L70.9] 05/08/2016   Subjective Data:  - See H&P for full HPI  Matthew Kaufman is a 46 y/o M who presented voluntarily with worsening depression, SI with plan to cut wrist, AH, and illicit substance use of alcohol/THC/cocaine. Pt is off medications and wants to be restarted and he agreed to trial of abilify and zoloft. He is motivated to seek treatment for his substance use and he is open to referral to a residential treatment program.  Continued Clinical Symptoms:  Alcohol Use Disorder Identification Test Final Score (AUDIT): 21 The "Alcohol Use Disorders Identification Test", Guidelines for Use in Primary Care, Second Edition.  World Science writerHealth Organization [facility](WHO). Score between 0-7:  no or low risk or alcohol related problems. Score between 8-15:  moderate risk of alcohol related problems. Score between 16-19:  high risk of alcohol related problems. Score 20 or above:  warrants further diagnostic evaluation for alcohol dependence and treatment.   CLINICAL FACTORS:   Severe Anxiety and/or Agitation Alcohol/Substance Abuse/Dependencies Schizophrenia:   Command hallucinatons   Musculoskeletal: Strength & Muscle Tone: within normal limits Gait & Station: normal Patient  leans: N/A  Psychiatric Specialty Exam: Physical Exam  Nursing note and vitals reviewed.   Review of Systems  Constitutional: Negative for chills and fever.  Respiratory: Negative for cough.   Gastrointestinal: Negative for abdominal pain, heartburn and nausea.  Psychiatric/Behavioral: Positive for depression, hallucinations and suicidal ideas.    Blood pressure 128/90, pulse 64, temperature (!) 97.5 F (36.4 C), temperature source Oral, resp. rate 16, height 5\' 9"  (1.753 m), weight 92.5 kg (204 lb).Body mass index is 30.13 kg/m.  General Appearance: Casual  Eye Contact:  Good  Speech:  Clear and Coherent and Normal Rate  Volume:  Normal  Mood:  Anxious  Affect:  Congruent and Constricted  Thought Process:  Coherent and Goal Directed  Orientation:  Full (Time, Place, and Person)  Thought Content:  Hallucinations: Auditory  Suicidal Thoughts:  Yes.  with intent/plan  Homicidal Thoughts:  No  Memory:  Immediate;   Good Recent;   Good Remote;   Good  Judgement:  Fair  Insight:  Fair  Psychomotor Activity:  Normal  Concentration:  Concentration: Good  Recall:  Fair  Fund of Knowledge:  Fair  Language:  Fair  Akathisia:  No  Handed:    AIMS (if indicated):     Assets:  Communication Skills Desire for Improvement Physical Health Resilience Social Support  ADL's:  Intact  Cognition:  WNL         COGNITIVE FEATURES THAT CONTRIBUTE TO RISK:  None    SUICIDE RISK:   Mild:  Suicidal ideation of limited frequency, intensity, duration, and specificity.  There are no identifiable plans, no associated intent, mild dysphoria and related symptoms, good self-control (  both objective and subjective assessment), few other risk factors, and identifiable protective factors, including available and accessible social support.  PLAN OF CARE:  - Admit to inpatient psychiatry unit - Schizophrenia  - Start abilify 10mg  qDay  - Start zoloft 50mg  qDay - Alcohol withdrawal  - Start  CIWA protocol with librium - Polysubstance abuse  - SW team to refer to substance use treatment - Labs: CBC, CMP, UDS, lipid panel, prolactin, HgbA1c, TSH - Encourage participation in groups and the therapeutic milieu - Discharge planning will be ongoing  I certify that inpatient services furnished can reasonably be expected to improve the patient's condition.   Micheal Likenshristopher T Jered Heiny, MD 12/23/2016, 5:02 PM

## 2016-12-23 NOTE — Progress Notes (Signed)
Patient denies SI, HI and AVH.  Patient reported nausea and body aches as a symptom of withdrawal,  Patient has had no incidents of behavioral dyscontrol.   Assess patient for safety, offer medications as prescribed, engage patient in 1:1 staff talks.   Patient able to contract for safety, continue to monitor as planned   
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
## 2016-12-23 NOTE — [facility] Suicide Risk Assessment (Signed)
[facility] INPATIENT:  Family/Significant Other Suicide Prevention Education  Suicide Prevention Education:  Patient Refusal for Family/Significant Other Suicide Prevention Education: The patient Matthew Kaufman has refused to provide written consent for family/significant other to be provided Family/Significant Other Suicide Prevention Education during admission and/or prior to discharge.  Physician notified.  Baldo DaubRodney B [facility] 12/23/2016, 1:50 PM

## 2016-12-23 NOTE — ED Triage Notes (Signed)
Pt [facility]. He reports that he has been having suicidal thoughts over the past few days and "needs to get back on his meds." He denies specific suicidal ideation at this time, but says he has a lot on his mind. He endorses drug use, but is not specific with this RN about which ones. A&Ox4.

## 2016-12-23 NOTE — [facility] Group Notes (Signed)
LCSW Group Therapy Note  12/23/2016 1:15pm  Type of Therapy/Topic:  Group Therapy:  Feelings about Diagnosis  Participation Level:  Did Not Attend   Description of Group:   This group will allow patients to explore their thoughts and feelings about diagnoses they have received. Patients will be guided to explore their level of understanding and acceptance of these diagnoses. Facilitator will encourage patients to process their thoughts and feelings about the reactions of others to their diagnosis and will guide patients in identifying ways to discuss their diagnosis with significant others in their lives. This group will be process-oriented, with patients participating in exploration of their own experiences, giving and receiving support, and processing challenge from other group members.   Therapeutic Goals: 1. Patient will demonstrate understanding of diagnosis as evidenced by identifying two or more symptoms of the disorder 2. Patient will be able to express two feelings regarding the diagnosis 3. Patient will demonstrate their ability to communicate their needs through discussion and/or role play  Summary of Patient Progress:       Therapeutic Modalities:   Cognitive Behavioral Therapy Brief Therapy Feelings Identification    Ida RogueRodney B Samuel Mcpeek, LCSW 12/23/2016 1:49 PM

## 2016-12-24 ENCOUNTER — Other Ambulatory Visit: Payer: Self-pay

## 2016-12-24 LAB — PROLACTIN: PROLACTIN: 15.9 ng/mL — AB (ref 4.0–15.2)

## 2016-12-24 MED ORDER — ONDANSETRON 4 MG PO TBDP
4.0000 mg | ORAL_TABLET | Freq: Four times a day (QID) | ORAL | Status: DC | PRN
  Administered 2016-12-24 – 2016-12-26 (×2): 4 mg via ORAL
  Filled 2016-12-24 (×3): qty 1

## 2016-12-24 MED ORDER — CHLORDIAZEPOXIDE HCL 25 MG PO CAPS: 25.0000 mg | ORAL_CAPSULE | ORAL | Status: DC

## 2016-12-24 MED ORDER — HYDROXYZINE HCL 25 MG PO TABS
25.0000 mg | ORAL_TABLET | Freq: Four times a day (QID) | ORAL | Status: DC | PRN
  Filled 2016-12-24: qty 30

## 2016-12-24 MED ORDER — LOPERAMIDE HCL 2 MG PO CAPS: 2.0000 mg | ORAL_CAPSULE | ORAL | Status: DC | PRN

## 2016-12-24 MED ORDER — CHLORDIAZEPOXIDE HCL 25 MG PO CAPS: 25.0000 mg | ORAL_CAPSULE | Freq: Every day | ORAL | Status: DC

## 2016-12-24 MED ORDER — VITAMIN B-1 100 MG PO TABS
100.0000 mg | ORAL_TABLET | Freq: Every day | ORAL | Status: DC
  Administered 2016-12-25 – 2016-12-26 (×2): 100 mg via ORAL
  Filled 2016-12-24 (×4): qty 1

## 2016-12-24 MED ORDER — CHLORDIAZEPOXIDE HCL 25 MG PO CAPS: 25.0000 mg | ORAL_CAPSULE | Freq: Three times a day (TID) | ORAL | Status: AC

## 2016-12-24 MED ORDER — HYDROCHLOROTHIAZIDE 12.5 MG PO CAPS
12.5000 mg | ORAL_CAPSULE | Freq: Every day | ORAL | Status: DC
  Administered 2016-12-24 – 2016-12-26 (×3): 12.5 mg via ORAL
  Filled 2016-12-24 (×5): qty 1

## 2016-12-24 MED ORDER — CHLORDIAZEPOXIDE HCL 25 MG PO CAPS: 25.0000 mg | ORAL_CAPSULE | Freq: Four times a day (QID) | ORAL | Status: DC | PRN

## 2016-12-24 MED ORDER — CHLORDIAZEPOXIDE HCL 25 MG PO CAPS
25.0000 mg | ORAL_CAPSULE | Freq: Four times a day (QID) | ORAL | Status: AC
  Administered 2016-12-24: 25 mg via ORAL
  Filled 2016-12-24: qty 1

## 2016-12-24 MED ORDER — ADULT MULTIVITAMIN W/MINERALS CH
1.0000 | ORAL_TABLET | Freq: Every day | ORAL | Status: DC
  Administered 2016-12-24 – 2016-12-26 (×3): 1 via ORAL
  Filled 2016-12-24 (×5): qty 1

## 2016-12-24 NOTE — Progress Notes (Signed)
[facility]  12/24/2016 4:03 PM Matthew AlmasDonald Kaufman  MRN:  [id number] Subjective:   Matthew Kaufman is a 46 y/o M with hx of schizophrenia who was admitted with worsening depression, SI, and use of alcohol. Today upon evaluation, pt reports that he has been feeling physically ill which he associates with symptoms of withdrawal. He is diaphoretic, describes general malaise and fatigue, and he has been having chills. He reports his mood is "bad" and he has SI without specific plan. He denies HI/AH/VH. He has been sleeping adequately and appetite is good. Pt requests to have increase in his medication to address symptoms of withdrawal and we discussed starting a librium taper rather than relying solely on CIWA scale for librium. Pt reports that he is too fatigued to participate in groups but he plans to participate when he can. He agrees to continue his other medications without changes. He had no further questions, comments, or concerns.  Principal Problem: Schizophrenia (HCC) Diagnosis:   Patient Active Problem List   Diagnosis Date Noted  . Substance induced mood disorder (HCC) [F19.94] 12/23/2016  . Schizophrenia (HCC) [F20.9] 12/23/2016  . Alcohol use disorder, severe, dependence (HCC) [F10.20] 12/23/2016  . HTN (hypertension) [I10] 05/08/2016  . Depression with anxiety [F41.8] 05/08/2016  . Insomnia [G47.00] 05/08/2016  . Substance abuse (HCC) [F19.10] 05/08/2016  . Cracked tooth [K03.81] 05/08/2016  . Acne [L70.9] 05/08/2016   Total Time spent with patient: 30 minutes  Past Psychiatric History: see h&P  Past Medical History:  Past Medical History:  Diagnosis Date  . Hypertension     Past Surgical History:  Procedure Laterality Date  . APPENDECTOMY     Family History: History reviewed. No pertinent family history. Family Psychiatric  History: see H&P Social History:  Social History   Substance and Sexual Activity  Alcohol Use Yes  . Alcohol/week: 3.6 oz  . Types: 6  Glasses of wine per week     Social History   Substance and Sexual Activity  Drug Use Yes  . Types: Marijuana, Cocaine    Social History   Socioeconomic History  . Marital status: Married    Spouse name: None  . Number of children: None  . Years of education: None  . Highest education level: None  Social Needs  . Financial resource strain: None  . Food insecurity - worry: None  . Food insecurity - inability: None  . Transportation needs - medical: None  . Transportation needs - non-medical: None  Occupational History  . None  Tobacco Use  . Smoking status: Current Every Day Smoker    Packs/day: 0.50    Types: Cigarettes  . Smokeless tobacco: Never Used  Substance and Sexual Activity  . Alcohol use: Yes    Alcohol/week: 3.6 oz    Types: 6 Glasses of wine per week  . Drug use: Yes    Types: Marijuana, Cocaine  . Sexual activity: None  Other Topics Concern  . None  Social History Narrative  . None   Additional Social History:    Pain Medications: denies Prescriptions: denies Over the Counter: denies History of alcohol / drug use?: Yes Name of Substance 1: ETOH 1 - Age of First Use: 46 years of age 34 - Amount (size/oz): Two 5th of wine and a 6 pack of beer daily. 1 - Frequency: Daily use 1 - Duration: on-going for the last 18 months 1 - Last Use / Amount: 11/05  Four beers Name of Substance 2: Marijuana 2 -  Age of First Use: 46 years of age 82 - Amount (size/oz): At least a joint per day. 2 - Frequency: Daily use 2 - Duration: Last 18 months 2 - Last Use / Amount: 11/05 Name of Substance 3: Cocaine 3 - Age of First Use: Teens 3 - Amount (size/oz): Varies according to finances 3 - Frequency: Finances determines frequency 3 - Duration: on-going 3 - Last Use / Amount: 11/05              Sleep: Fair  Appetite:  Fair  Current Medications: Current Facility-Administered Medications  Medication Dose Route Frequency Provider Last Rate Last Dose  .  acetaminophen (TYLENOL) tablet 650 mg  650 mg Oral Q6H PRN Kerry HoughSimon, Spencer E, PA-C   650 mg at 12/24/16 [id number]  . alum & mag hydroxide-simeth (MAALOX/MYLANTA) 200-200-20 MG/5ML suspension 30 mL  30 mL Oral Q4H PRN Donell SievertSimon, Spencer E, PA-C      . ARIPiprazole (ABILIFY) tablet 10 mg  10 mg Oral Daily Micheal Likensainville, Makiya Jeune T, MD   10 mg at 12/24/16 0830  . chlordiazePOXIDE (LIBRIUM) capsule 25 mg  25 mg Oral Q6H PRN Nwoko, Agnes I, NP      . chlordiazePOXIDE (LIBRIUM) capsule 25 mg  25 mg Oral QID Armandina StammerNwoko, Agnes I, NP       Followed by  . [START ON 12/25/2016] chlordiazePOXIDE (LIBRIUM) capsule 25 mg  25 mg Oral TID Sanjuana KavaNwoko, Agnes I, NP       Followed by  . [START ON 12/26/2016] chlordiazePOXIDE (LIBRIUM) capsule 25 mg  25 mg Oral BH-qamhs Nwoko, Agnes I, NP       Followed by  . [START ON 12/27/2016] chlordiazePOXIDE (LIBRIUM) capsule 25 mg  25 mg Oral Daily Nwoko, Agnes I, NP      . feeding supplement (ENSURE ENLIVE) (ENSURE ENLIVE) liquid 237 mL  237 mL Oral Q24H Cobos, Fernando A, MD      . hydrochlorothiazide (MICROZIDE) capsule 12.5 mg  12.5 mg Oral Daily Cobos, Fernando A, MD      . hydrOXYzine (ATARAX/VISTARIL) tablet 25 mg  25 mg Oral Q6H PRN Armandina StammerNwoko, Agnes I, NP      . Influenza vac split quadrivalent PF (FLUARIX) injection 0.5 mL  0.5 mL Intramuscular Tomorrow-1000 Simon, Spencer E, PA-C      . loperamide (IMODIUM) capsule 2-4 mg  2-4 mg Oral PRN Micheal Likensainville, Monterrius Cardosa T, MD      . loperamide (IMODIUM) capsule 2-4 mg  2-4 mg Oral PRN Armandina StammerNwoko, Agnes I, NP      . magnesium hydroxide (MILK OF MAGNESIA) suspension 30 mL  30 mL Oral Daily PRN Kerry HoughSimon, Spencer E, PA-C      . multivitamin with minerals tablet 1 tablet  1 tablet Oral Daily Nwoko, Agnes I, NP      . ondansetron (ZOFRAN-ODT) disintegrating tablet 4 mg  4 mg Oral Q6H PRN Armandina StammerNwoko, Agnes I, NP      . pneumococcal 23 valent vaccine (PNU-IMMUNE) injection 0.5 mL  0.5 mL Intramuscular Tomorrow-1000 Simon, Spencer E, PA-C      . sertraline (ZOLOFT) tablet  50 mg  50 mg Oral Daily Donell SievertSimon, Spencer E, PA-C   50 mg at 12/24/16 [id number]  . [START ON 12/25/2016] thiamine (VITAMIN B-1) tablet 100 mg  100 mg Oral Daily Nwoko, Agnes I, NP      . traZODone (DESYREL) tablet 50 mg  50 mg Oral QHS,MR X 1 Kerry HoughSimon, Spencer E, PA-C        Lab Results:  Results  for orders placed or performed during the hospital encounter of 12/23/16 (from the past 48 hour(s))  TSH     Status: None   Collection Time: 12/23/16  6:34 PM  Result Value Ref Range   TSH 0.651 0.350 - 4.500 uIU/mL    Comment: Performed by a 3rd Generation assay with a functional sensitivity of <=0.01 uIU/mL. Performed at [facility], [address] [place], [place] [address]   Prolactin     Status: Abnormal   Collection Time: 12/23/16  6:34 PM  Result Value Ref Range   Prolactin 15.9 (H) 4.0 - 15.2 ng/mL    Comment: (NOTE) Performed At: [facility] [address] [place], [place] [id number] Jolene Schimke MD [id number] Performed at [facility], [address], [place], [place] [address]   Lipid panel     Status: None   Collection Time: 12/23/16  6:34 PM  Result Value Ref Range   Cholesterol 171 0 - 200 mg/dL   Triglycerides 784 <696 mg/dL   HDL 64 >29 mg/dL   Total CHOL/HDL Ratio 2.7 RATIO   VLDL 22 0 - 40 mg/dL   LDL Cholesterol 85 0 - 99 mg/dL    Comment:        Total Cholesterol/HDL:CHD Risk Coronary Heart Disease Risk Table                     Men   Women  1/2 Average Risk   3.4   3.3  Average Risk       5.0   4.4  2 X Average Risk   9.6   7.1  3 X Average Risk  23.4   11.0        Use the calculated Patient Ratio above and the CHD Risk Table to determine the patient's CHD Risk.        ATP III CLASSIFICATION (LDL):  <100     mg/dL   Optimal  528-413  mg/dL   Near or Above                    Optimal  130-159  mg/dL   Borderline  244-010  mg/dL   High  >272     mg/dL   Very High Performed at [facility], [address] [place], [place] [address]   Hemoglobin A1c     Status: Abnormal   Collection Time: 12/23/16  6:34 PM  Result Value Ref Range   Hgb A1c MFr Bld 6.3 (H) 4.8 - 5.6 %    Comment: (NOTE) Pre diabetes:          5.7%-6.4% Diabetes:              >6.4% Glycemic control for   <7.0% adults with diabetes    Mean Plasma Glucose 134.11 mg/dL    Comment: Performed at [facility], [address] [place], [place] [address]    Blood Alcohol level:  Lab Results  Component Value Date   ETH <10 12/23/2016    Metabolic Disorder Labs: Lab Results  Component Value Date   HGBA1C 6.3 (H) 12/23/2016   MPG 134.11 12/23/2016   Lab Results  Component Value Date   PROLACTIN 15.9 (H) 12/23/2016   Lab Results  Component Value Date   CHOL 171 12/23/2016   TRIG 111 12/23/2016   HDL 64 12/23/2016   CHOLHDL 2.7 12/23/2016   VLDL 22 12/23/2016   LDLCALC 85 12/23/2016  LDLCALC 122 (H) 05/08/2016    Physical Findings: AIMS: Facial and Oral Movements Muscles of Facial Expression: None, normal Lips and Perioral Area: None, normal Jaw: None, normal Tongue: None, normal,Extremity Movements Upper (arms, wrists, hands, fingers): None, normal Lower (legs, knees, ankles, toes): None, normal, Trunk Movements Neck, shoulders, hips: None, normal, Overall Severity Severity of abnormal movements (highest score from questions above): None, normal Incapacitation due to abnormal movements: None, normal Patient's awareness of abnormal movements (rate only patient's report): No Awareness, Dental Status Current problems with teeth and/or dentures?: No Does patient usually wear dentures?: No  CIWA:  CIWA-Ar Total: 14 COWS:     Musculoskeletal: Strength & Muscle Tone: within normal limits Gait & Station: normal Patient leans: N/A  Psychiatric Specialty Exam: Physical Exam  Nursing note and vitals reviewed.   Review of Systems  Constitutional: Positive for chills, diaphoresis and malaise/fatigue.  Negative for fever.  Respiratory: Negative for cough and shortness of breath.   Gastrointestinal: Negative for abdominal pain, heartburn, nausea and vomiting.  Psychiatric/Behavioral: Positive for depression and suicidal ideas. Negative for hallucinations. The patient is nervous/anxious.     Blood pressure (!) 151/91, pulse (!) 54, temperature 98.5 F (36.9 C), temperature source Oral, resp. rate 16, height 5\' 9"  (1.753 m), weight 92.5 kg (204 lb).Body mass index is 30.13 kg/m.  General Appearance: Casual and Fairly Groomed  Eye Contact:  Good  Speech:  Clear and Coherent and Normal Rate  Volume:  Decreased  Mood:  Anxious, Depressed and Dysphoric  Affect:  Congruent and Constricted  Thought Process:  Coherent and Goal Directed  Orientation:  Full (Time, Place, and Person)  Thought Content:  Logical  Suicidal Thoughts:  Yes.  without intent/plan  Homicidal Thoughts:  No  Memory:  Immediate;   Fair Recent;   Fair Remote;   Fair  Judgement:  Fair  Insight:  Fair  Psychomotor Activity:  Normal  Concentration:  Concentration: Fair  Recall:  Good  Fund of Knowledge:  Good  Language:  Good  Akathisia:  No  Handed:    AIMS (if indicated):     Assets:  Physical Health Resilience Social Support  ADL's:  Intact  Cognition:  WNL  Sleep:  Number of Hours: 6.25     Treatment Plan Summary: Daily contact with patient to assess and evaluate symptoms and progress in treatment and Medication management. Pt reports ongoing SI without plan but he is focused on physical symptoms of alcohol withdrawal.  - Continue inpatient hospitalization. - Schizophrenia             -Continue abilify 10mg  qDay             - Continue zoloft 50mg  qDay - Alcohol withdrawal             - Start librium taper - Polysubstance abuse             - SW team to refer to substance use treatment - Encourage participation in groups and the therapeutic milieu - Discharge planning will be ongoing   Micheal Likens, MD 12/24/2016, 4:03 PM

## 2016-12-24 NOTE — [facility] Group Notes (Signed)
LCSW Group Therapy Note   12/24/2016 1:15pm   Type of Therapy and Topic:  Group Therapy:  Trust and Honesty  Participation Level:  Did Not Attend  Description of Group:    In this group patients will be asked to explore the value of being honest.  Patients will be guided to discuss their thoughts, feelings, and behaviors related to honesty and trusting in others. Patients will process together how trust and honesty relate to forming relationships with peers, family members, and self. Each patient will be challenged to identify and express feelings of being vulnerable. Patients will discuss reasons why people are dishonest and identify alternative outcomes if one was truthful (to self or others). This group will be process-oriented, with patients participating in exploration of their own experiences, giving and receiving support, and processing challenge from other group members.   Therapeutic Goals: 1. Patient will identify why honesty is important to relationships and how honesty overall affects relationships.  2. Patient will identify a situation where they lied or were lied too and the  feelings, thought process, and behaviors surrounding the situation 3. Patient will identify the meaning of being vulnerable, how that feels, and how that correlates to being honest with self and others. 4. Patient will identify situations where they could have told the truth, but instead lied and explain reasons of dishonesty.   Summary of Patient Progress    Therapeutic Modalities:   Cognitive Behavioral Therapy Solution Focused Therapy Motivational Interviewing Brief Therapy  Ida RogueRodney B Jonaya Freshour, LCSW 12/24/2016 3:11 PM

## 2016-12-24 NOTE — Sepsis Progress Note (Signed)
Patient denies SI, HI and AVH.  Patient reported nausea and body aches as a symptom of withdrawal,  Patient has had no incidents of behavioral dyscontrol.   Assess patient for safety, offer medications as prescribed, engage patient in 1:1 staff talks.   Patient able to contract for safety, continue to monitor as planned

## 2016-12-24 NOTE — Progress Notes (Signed)
Patient ID: Matthew Kaufman, male   DOB: 05/05/1970, 46 y.o.   MRN: [id number]  D: Patient in bed at shift change. Did not get up for group or any medication tonight. Checked on him x 3. No distress noted. Respirations even and non-labored. Will assess when awake. A: Staff will monitor on q 15 minute checks, follow treatment plan, and give meds as ordered. R: Cooperative on the unit

## 2016-12-24 NOTE — Progress Notes (Signed)
Pt sleeping in bed. Will continue to monitor.

## 2016-12-24 NOTE — Progress Notes (Signed)
Pt did not attend group. 

## 2016-12-24 NOTE — Progress Notes (Signed)
Recreation Therapy Notes  Date: 12/24/16 Time: 1000 Location: [address] Dayroom  Group Topic: Leisure Education, Goal Setting  Goal Area(s) Addresses:  Patient will be able to identify at least 3 goals for leisure participation.  Patient will be able to identify benefit of investing in leisure participation.  Patient will be able to identify benefit of setting leisure goals.   Intervention: Worksheet  Activity: Life Goals.  Patients were given a worksheet that was broken down into 6 categories (family, friends, work/school, body, mental health and spirituality).  Patients were to identify what they were doing well, what they needed to improve and write a goal to make that improvement.  Education:  Discharge Planning, PharmacologistCoping Skills, Leisure Education   Education Outcome: Acknowledges Education/In Group Clarification Provided/Needs Additional Education  Clinical Observations:  Pt did not attend group.   Caroll RancherMarjette Aubryn Spinola, LRT/CTRS         Caroll RancherLindsay, Ting Cage A 12/24/2016 12:44 PM

## 2016-12-24 NOTE — Tx Team (Signed)
Interdisciplinary Treatment and Diagnostic Plan Update  12/24/2016 Time of Session: 9:11 AM  Matthew Kaufman MRN: [id number]  Principal Diagnosis: Schizophrenia [facility])  Secondary Diagnoses: Principal Problem:   Schizophrenia ([place]) Active Problems:   Alcohol use disorder, severe, dependence ([place])   Current Medications:  Current Facility-Administered Medications  Medication Dose Route Frequency Provider Last Rate Last Dose  . acetaminophen (TYLENOL) tablet 650 mg  650 mg Oral Q6H PRN Laverle Hobby, PA-C   650 mg at 12/24/16 5329  . alum & mag hydroxide-simeth (MAALOX/MYLANTA) 200-200-20 MG/5ML suspension 30 mL  30 mL Oral Q4H PRN Patriciaann Clan E, PA-C      . ARIPiprazole (ABILIFY) tablet 10 mg  10 mg Oral Daily Pennelope Bracken, MD   10 mg at 12/24/16 0830  . chlordiazePOXIDE (LIBRIUM) capsule 25 mg  25 mg Oral Q6H PRN Pennelope Bracken, MD   25 mg at 12/24/16 0512  . feeding supplement (ENSURE ENLIVE) (ENSURE ENLIVE) liquid 237 mL  237 mL Oral Q24H Cobos, Fernando A, MD      . hydrochlorothiazide (HYDRODIURIL) tablet 12.5 mg  12.5 mg Oral Daily Patriciaann Clan E, PA-C   12.5 mg at 12/24/16 9242  . hydrOXYzine (ATARAX/VISTARIL) tablet 25 mg  25 mg Oral Q6H PRN Laverle Hobby, PA-C      . Influenza vac split quadrivalent PF (FLUARIX) injection 0.5 mL  0.5 mL Intramuscular Tomorrow-1000 Simon, Spencer E, PA-C      . loperamide (IMODIUM) capsule 2-4 mg  2-4 mg Oral PRN Pennelope Bracken, MD      . magnesium hydroxide (MILK OF MAGNESIA) suspension 30 mL  30 mL Oral Daily PRN Patriciaann Clan E, PA-C      . multivitamin with minerals tablet 1 tablet  1 tablet Oral Daily Pennelope Bracken, MD   1 tablet at 12/24/16 0830  . ondansetron (ZOFRAN-ODT) disintegrating tablet 4 mg  4 mg Oral Q6H PRN Pennelope Bracken, MD   4 mg at 12/23/16 1659  . pneumococcal 23 valent vaccine (PNU-IMMUNE) injection 0.5 mL  0.5 mL Intramuscular Tomorrow-1000 Simon, Spencer E, PA-C       . sertraline (ZOLOFT) tablet 50 mg  50 mg Oral Daily Patriciaann Clan E, PA-C   50 mg at 12/24/16 6834  . thiamine (B-1) injection 100 mg  100 mg Intramuscular Once Maris Berger T, MD      . thiamine (VITAMIN B-1) tablet 100 mg  100 mg Oral Daily Pennelope Bracken, MD   100 mg at 12/24/16 0830  . traZODone (DESYREL) tablet 50 mg  50 mg Oral QHS,MR X 1 Simon, Spencer E, PA-C        PTA Medications: Medications Prior to Admission  Medication Sig Dispense Refill Last Dose  . hydrochlorothiazide (HYDRODIURIL) 12.5 MG tablet Take 1 tablet (12.5 mg total) by mouth daily. 90 tablet 3 unknown  . Melatonin 5 MG TABS Take 1 tablet (5 mg total) by mouth at bedtime. (Patient not taking: Reported on 12/23/2016) 30 tablet 2 Not Taking at Unknown time  . Salicylic Acid 2 % PADS Apply 1 application topically 2 (two) times daily. (Patient not taking: Reported on 12/23/2016) 60 each 0 Not Taking at Unknown time  . sertraline (ZOLOFT) 50 MG tablet Take 1 tablet (50 mg total) by mouth daily. 30 tablet 3 unknown    Patient Stressors: Financial difficulties Loss of housing Medication change or noncompliance Substance abuse  Patient Strengths: Ability for insight Average or above average Architect  for treatment/growth Supportive family/friends  Treatment Modalities: Medication Management, Group therapy, Case management,  1 to 1 session with clinician, Psychoeducation, Recreational therapy.   Physician Treatment Plan for Primary Diagnosis: Schizophrenia ([place]) Long Term Goal(s): Improvement in symptoms so as ready for discharge  Short Term Goals: Ability to identify changes in lifestyle to reduce recurrence of condition will improve Compliance with prescribed medications will improve Ability to identify changes in lifestyle to reduce recurrence of condition will improve Ability to demonstrate self-control will improve Ability to identify and develop  effective coping behaviors will improve Compliance with prescribed medications will improve  Medication Management: Evaluate patient's response, side effects, and tolerance of medication regimen.  Therapeutic Interventions: 1 to 1 sessions, Unit Group sessions and Medication administration.  Evaluation of Outcomes: Progressing  Physician Treatment Plan for Secondary Diagnosis: Principal Problem:   Schizophrenia ([place]) Active Problems:   Alcohol use disorder, severe, dependence ([place])   Long Term Goal(s): Improvement in symptoms so as ready for discharge  Short Term Goals: Ability to identify changes in lifestyle to reduce recurrence of condition will improve Compliance with prescribed medications will improve Ability to identify changes in lifestyle to reduce recurrence of condition will improve Ability to demonstrate self-control will improve Ability to identify and develop effective coping behaviors will improve Compliance with prescribed medications will improve  Medication Management: Evaluate patient's response, side effects, and tolerance of medication regimen.  Therapeutic Interventions: 1 to 1 sessions, Unit Group sessions and Medication administration.  Evaluation of Outcomes: Progressing   RN Treatment Plan for Primary Diagnosis: Schizophrenia ([place]) Long Term Goal(s): Knowledge of disease and therapeutic regimen to maintain health will improve  Short Term Goals: Ability to identify and develop effective coping behaviors will improve and Compliance with prescribed medications will improve  Medication Management: RN will administer medications as ordered by provider, will assess and evaluate patient's response and provide education to patient for prescribed medication. RN will report any adverse and/or side effects to prescribing provider.  Therapeutic Interventions: 1 on 1 counseling sessions, Psychoeducation, Medication administration, Evaluate responses to treatment,  Monitor vital signs and CBGs as ordered, Perform/monitor CIWA, COWS, AIMS and Fall Risk screenings as ordered, Perform wound care treatments as ordered.  Evaluation of Outcomes: Progressing   LCSW Treatment Plan for Primary Diagnosis: Schizophrenia ([place]) Long Term Goal(s): Safe transition to appropriate next level of care at discharge, Engage patient in therapeutic group addressing interpersonal concerns.  Short Term Goals: Engage patient in aftercare planning with referrals and resources  Therapeutic Interventions: Assess for all discharge needs, 1 to 1 time with Social worker, Explore available resources and support systems, Assess for adequacy in community support network, Educate family and significant other(s) on suicide prevention, Complete Psychosocial Assessment, Interpersonal group therapy.  Evaluation of Outcomes: Met   Pt hopes to get into [facility] or [facility] from here   Progress in Treatment: Attending groups: Yes Participating in groups: Yes Taking medication as prescribed: Yes Toleration medication: Yes, no side effects reported at this time Family/Significant other contact made: No Patient understands diagnosis: Yes AEB asking for help with mental health and substance abuse Discussing patient identified problems/goals with staff: Yes Medical problems stabilized or resolved: Yes Denies suicidal/homicidal ideation: Yes Issues/concerns per patient self-inventory: None Other: N/A  New problem(s) identified: None identified at this time.   New Short Term/Long Term Goal(s):  "I want to get back on my meds"  "find a place, like a 90 day treatment program"      Discharge Plan or  Barriers:   Reason for Continuation of Hospitalization: Hallucinations  Medication stabilization Suicidal ideation Withdrawal symptoms  Estimated Length of Stay: 11/12  Attendees: Patient: Matthew Kaufman 12/24/2016  9:11 AM  Physician: Maris Berger, MD 12/24/2016  9:11 AM  Nursing:  Sena Hitch, RN 12/24/2016  9:11 AM  RN Care Manager: Lars Pinks, RN 12/24/2016  9:11 AM  Social Worker: Ripley Fraise 12/24/2016  9:11 AM  Recreational Therapist: Winfield Cunas 12/24/2016  9:11 AM  Other: Norberto Sorenson 12/24/2016  9:11 AM  Other:  12/24/2016  9:11 AM    Scribe for Treatment Team:  Roque Lias LCSW 12/24/2016 9:11 AM

## 2016-12-25 DIAGNOSIS — F1210 Cannabis abuse, uncomplicated: Secondary | ICD-10-CM

## 2016-12-25 DIAGNOSIS — F29 Unspecified psychosis not due to a substance or known physiological condition: Secondary | ICD-10-CM

## 2016-12-25 DIAGNOSIS — F1910 Other psychoactive substance abuse, uncomplicated: Secondary | ICD-10-CM

## 2016-12-25 DIAGNOSIS — F1410 Cocaine abuse, uncomplicated: Secondary | ICD-10-CM

## 2016-12-25 DIAGNOSIS — R5381 Other malaise: Secondary | ICD-10-CM

## 2016-12-25 DIAGNOSIS — R450 Nervousness: Secondary | ICD-10-CM

## 2016-12-25 MED ORDER — NICOTINE 21 MG/24HR TD PT24
21.0000 mg | MEDICATED_PATCH | Freq: Every day | TRANSDERMAL | Status: DC
  Administered 2016-12-25 – 2016-12-26 (×2): 21 mg via TRANSDERMAL
  Filled 2016-12-25: qty 21
  Filled 2016-12-25 (×3): qty 1

## 2016-12-25 NOTE — Progress Notes (Signed)
[facility]  12/25/2016 3:06 PM Matthew AlmasDonald Fulcher  MRN:  [id number] Subjective:   Matthew Kaufman is a 46 y/o M with history of schizophrenia and alcohol use disorder who presented with worsening depression, psychosis, and alcohol use. He was started on alcohol withdrawal protocol and restarted on psychotropic medications of abilify and zoloft. Today upon evaluation, pt reports that he is feeling fatigued. He explains, "I'm scared - I don't have no energy today." Discussed with patient that he is on a librium taper to prevent alcohol withdrawal, and that medication can cause drowsiness. Also, pt has been restarted on psychotropic medications after not taking them outside the hospital, so there is an expected period of adjustment. Pt reports that overall his mood is slightly improved and he is not experiencing any hallucinations. He denies SI/HI/AH/VH. He has been working with SW team regarding arranging for substance use treatment, and he [facility], but pt reports that he was on hold for 5 minutes before just hanging up. Pt reports he felt anxious waiting and he physically did not feel well enough to continue waiting on the phone due to his withdrawal symptoms. Encouraged pt to try again as working towards a discharge plan that helps maintain his sobriety would be the ideal treatment recommendation. Pt verbalized understanding. He was in agreement to continue his current regimen without changes. He had no further questions, comments, or concerns.  Principal Problem: Schizophrenia (HCC) Diagnosis:   Patient Active Problem List   Diagnosis Date Noted  . Substance induced mood disorder (HCC) [F19.94] 12/23/2016  . Schizophrenia (HCC) [F20.9] 12/23/2016  . Alcohol use disorder, severe, dependence (HCC) [F10.20] 12/23/2016  . HTN (hypertension) [I10] 05/08/2016  . Depression with anxiety [F41.8] 05/08/2016  . Insomnia [G47.00] 05/08/2016  . Substance abuse  (HCC) [F19.10] 05/08/2016  . Cracked tooth [K03.81] 05/08/2016  . Acne [L70.9] 05/08/2016   Total Time spent with patient: 30 minutes  Past Psychiatric History: see H&P  Past Medical History:  Past Medical History:  Diagnosis Date  . Hypertension     Past Surgical History:  Procedure Laterality Date  . APPENDECTOMY     Family History: History reviewed. No pertinent family history. Family Psychiatric  History: see H&P Social History:  Social History   Substance and Sexual Activity  Alcohol Use Yes  . Alcohol/week: 3.6 oz  . Types: 6 Glasses of wine per week     Social History   Substance and Sexual Activity  Drug Use Yes  . Types: Marijuana, Cocaine    Social History   Socioeconomic History  . Marital status: Married    Spouse name: None  . Number of children: None  . Years of education: None  . Highest education level: None  Social Needs  . Financial resource strain: None  . Food insecurity - worry: None  . Food insecurity - inability: None  . Transportation needs - medical: None  . Transportation needs - non-medical: None  Occupational History  . None  Tobacco Use  . Smoking status: Current Every Day Smoker    Packs/day: 0.50    Types: Cigarettes  . Smokeless tobacco: Never Used  Substance and Sexual Activity  . Alcohol use: Yes    Alcohol/week: 3.6 oz    Types: 6 Glasses of wine per week  . Drug use: Yes    Types: Marijuana, Cocaine  . Sexual activity: None  Other Topics Concern  . None  Social History Narrative  .  None   Additional Social History:    Pain Medications: denies Prescriptions: denies Over the Counter: denies History of alcohol / drug use?: Yes Name of Substance 1: ETOH 1 - Age of First Use: 46 years of age 30 - Amount (size/oz): Two 5th of wine and a 6 pack of beer daily. 1 - Frequency: Daily use 1 - Duration: on-going for the last 18 months 1 - Last Use / Amount: 11/05  Four beers Name of Substance 2: Marijuana 2 - Age of  First Use: 46 years of age 46 - Amount (size/oz): At least a joint per day. 2 - Frequency: Daily use 2 - Duration: Last 18 months 2 - Last Use / Amount: 11/05 Name of Substance 3: Cocaine 3 - Age of First Use: Teens 3 - Amount (size/oz): Varies according to finances 3 - Frequency: Finances determines frequency 3 - Duration: on-going 3 - Last Use / Amount: 11/05              Sleep: Good  Appetite:  Good  Current Medications: Current Facility-Administered Medications  Medication Dose Route Frequency Provider Last Rate Last Dose  . acetaminophen (TYLENOL) tablet 650 mg  650 mg Oral Q6H PRN Kerry Hough, PA-C   650 mg at 12/24/16 1610  . alum & mag hydroxide-simeth (MAALOX/MYLANTA) 200-200-20 MG/5ML suspension 30 mL  30 mL Oral Q4H PRN Donell Sievert E, PA-C      . ARIPiprazole (ABILIFY) tablet 10 mg  10 mg Oral Daily Micheal Likens, MD   10 mg at 12/25/16 9604  . chlordiazePOXIDE (LIBRIUM) capsule 25 mg  25 mg Oral Q6H PRN Nwoko, Agnes I, NP      . chlordiazePOXIDE (LIBRIUM) capsule 25 mg  25 mg Oral TID Armandina Stammer I, NP       Followed by  . [START ON 12/26/2016] chlordiazePOXIDE (LIBRIUM) capsule 25 mg  25 mg Oral BH-qamhs Nwoko, Agnes I, NP       Followed by  . [START ON 12/27/2016] chlordiazePOXIDE (LIBRIUM) capsule 25 mg  25 mg Oral Daily Nwoko, Agnes I, NP      . feeding supplement (ENSURE ENLIVE) (ENSURE ENLIVE) liquid 237 mL  237 mL Oral Q24H Cobos, Fernando A, MD      . hydrochlorothiazide (MICROZIDE) capsule 12.5 mg  12.5 mg Oral Daily Cobos, Rockey Situ, MD   12.5 mg at 12/25/16 5409  . hydrOXYzine (ATARAX/VISTARIL) tablet 25 mg  25 mg Oral Q6H PRN Armandina Stammer I, NP      . loperamide (IMODIUM) capsule 2-4 mg  2-4 mg Oral PRN Micheal Likens, MD      . loperamide (IMODIUM) capsule 2-4 mg  2-4 mg Oral PRN Armandina Stammer I, NP      . magnesium hydroxide (MILK OF MAGNESIA) suspension 30 mL  30 mL Oral Daily PRN Kerry Hough, PA-C      . multivitamin  with minerals tablet 1 tablet  1 tablet Oral Daily Armandina Stammer I, NP   1 tablet at 12/25/16 [phone]  . nicotine (NICODERM CQ - dosed in mg/24 hours) patch 21 mg  21 mg Transdermal Daily Micheal Likens, MD   21 mg at 12/25/16 1307  . ondansetron (ZOFRAN-ODT) disintegrating tablet 4 mg  4 mg Oral Q6H PRN Armandina Stammer I, NP   4 mg at 12/24/16 1628  . sertraline (ZOLOFT) tablet 50 mg  50 mg Oral Daily Kerry Hough, PA-C   50 mg at 12/25/16 1478  . thiamine (  VITAMIN B-1) tablet 100 mg  100 mg Oral Daily Armandina Stammer I, NP   100 mg at 12/25/16 1610  . traZODone (DESYREL) tablet 50 mg  50 mg Oral QHS,MR X 1 Kerry Hough, PA-C        Lab Results:  Results for orders placed or performed during the hospital encounter of 12/23/16 (from the past 48 hour(s))  TSH     Status: None   Collection Time: 12/23/16  6:34 PM  Result Value Ref Range   TSH 0.651 0.350 - 4.500 uIU/mL    Comment: Performed by a 3rd Generation assay with a functional sensitivity of <=0.01 uIU/mL. Performed at [facility], [address], [place], [place] [address]   Prolactin     Status: Abnormal   Collection Time: 12/23/16  6:34 PM  Result Value Ref Range   Prolactin 15.9 (H) 4.0 - 15.2 ng/mL    Comment: (NOTE) Performed At: [facility] [address] [place], [place] [id number] Jolene Schimke MD NW:[id number] Performed at [facility], [address], [place], [place] [address]   Lipid panel     Status: None   Collection Time: 12/23/16  6:34 PM  Result Value Ref Range   Cholesterol 171 0 - 200 mg/dL   Triglycerides 962 <952 mg/dL   HDL 64 >84 mg/dL   Total CHOL/HDL Ratio 2.7 RATIO   VLDL 22 0 - 40 mg/dL   LDL Cholesterol 85 0 - 99 mg/dL    Comment:        Total Cholesterol/HDL:CHD Risk Coronary Heart Disease Risk Table                     Men   Women  1/2 Average Risk   3.4   3.3  Average Risk       5.0   4.4  2 X Average Risk   9.6   7.1  3 X Average  Risk  23.4   11.0        Use the calculated Patient Ratio above and the CHD Risk Table to determine the patient's CHD Risk.        ATP III CLASSIFICATION (LDL):  <100     mg/dL   Optimal  132-440  mg/dL   Near or Above                    Optimal  130-159  mg/dL   Borderline  102-725  mg/dL   High  >366     mg/dL   Very High Performed at [facility], [address], [place], [place] [address]   Hemoglobin A1c     Status: Abnormal   Collection Time: 12/23/16  6:34 PM  Result Value Ref Range   Hgb A1c MFr Bld 6.3 (H) 4.8 - 5.6 %    Comment: (NOTE) Pre diabetes:          5.7%-6.4% Diabetes:              >6.4% Glycemic control for   <7.0% adults with diabetes    Mean Plasma Glucose 134.11 mg/dL    Comment: Performed at [facility], [address] [place], [place] [address]    Blood Alcohol level:  Lab Results  Component Value Date   [facility] <10 12/23/2016    Metabolic Disorder Labs: Lab Results  Component Value Date   HGBA1C 6.3 (H) 12/23/2016   MPG 134.11 12/23/2016  Lab Results  Component Value Date   PROLACTIN 15.9 (H) 12/23/2016   Lab Results  Component Value Date   CHOL 171 12/23/2016   TRIG 111 12/23/2016   HDL 64 12/23/2016   CHOLHDL 2.7 12/23/2016   VLDL 22 12/23/2016   LDLCALC 85 12/23/2016   LDLCALC 122 (H) 05/08/2016    Physical Findings: AIMS: Facial and Oral Movements Muscles of Facial Expression: None, normal Lips and Perioral Area: None, normal Jaw: None, normal Tongue: None, normal,Extremity Movements Upper (arms, wrists, hands, fingers): None, normal Lower (legs, knees, ankles, toes): None, normal, Trunk Movements Neck, shoulders, hips: None, normal, Overall Severity Severity of abnormal movements (highest score from questions above): None, normal Incapacitation due to abnormal movements: None, normal Patient's awareness of abnormal movements (rate only patient's report): No Awareness, Dental Status Current problems with  teeth and/or dentures?: No Does patient usually wear dentures?: No  CIWA:  CIWA-Ar Total: 3 COWS:     Musculoskeletal: Strength & Muscle Tone: within normal limits Gait & Station: normal Patient leans: N/A  Psychiatric Specialty Exam: Physical Exam  Nursing note and vitals reviewed.   Review of Systems  Constitutional: Positive for malaise/fatigue. Negative for chills and fever.  Respiratory: Negative for cough.   Gastrointestinal: Negative for abdominal pain, heartburn, nausea and vomiting.  Psychiatric/Behavioral: Negative for depression, hallucinations and suicidal ideas. The patient is nervous/anxious.     Blood pressure 139/77, pulse 66, temperature 98.4 F (36.9 C), resp. rate 20, height 5\' 9"  (1.753 m), weight 92.5 kg (204 lb).Body mass index is 30.13 kg/m.  General Appearance: Casual  Eye Contact:  Good  Speech:  Clear and Coherent and Normal Rate  Volume:  Normal  Mood:  Anxious and Dysphoric  Affect:  Congruent and Constricted  Thought Process:  Coherent and Goal Directed  Orientation:  Full (Time, Place, and Person)  Thought Content:  Logical  Suicidal Thoughts:  No  Homicidal Thoughts:  No  Memory:  Immediate;   Good Recent;   Good Remote;   Good  Judgement:  Fair  Insight:  Fair  Psychomotor Activity:  Normal  Concentration:  Concentration: Good  Recall:  Fair  Fund of Knowledge:  Fair  Language:  Fair  Akathisia:  No  Handed:    AIMS (if indicated):     Assets:  Communication Skills Desire for Improvement Resilience Social Support  ADL's:  Intact  Cognition:  WNL  Sleep:  Number of Hours: 6.25     Treatment Plan Summary: Daily contact with patient to assess and evaluate symptoms and progress in treatment and Medication management. Pt continues to have physical discomfort and fatigue associated with withdrawal, but he notes that his mood is improved and he denies psychotic symptoms. He is working with SW team to arrange substance treatment after  discharge. - Continue inpatient hospitalization. - Schizophrenia -Continue abilify 10mg  qDay - Continue zoloft 50mg  qDay - Alcohol withdrawal - Continue librium taper - Polysubstance abuse - SW team to refer to substance use treatment - Encourage participation in groups and the therapeutic milieu - Discharge planning will be ongoing  Micheal Likenshristopher T Jaylene Schrom, MD 12/25/2016, 3:06 PM

## 2016-12-25 NOTE — Progress Notes (Signed)
D: Patient denies SI, HI or AVH. Patient presents as flat, depressed and slightly irritable but calm and cooperative.  Pt. States he didn't sleep well because "someone was up all night yelling".  Pt. Forwards little and has been minimally active within the milieu.  Pt. Denies any new complaints and states he "cant wait to get out of here"  A: Patient given emotional support from RN. Patient encouraged to come to staff with concerns and/or questions. Patient's medication routine continued. Patient's orders and plan of care reviewed.   R: Patient remains appropriate and cooperative. Will continue to monitor patient q15 minutes for safety.

## 2016-12-25 NOTE — [facility] Group Notes (Signed)
LCSW Group Therapy 12/25/2016 1:15pm  Type of Therapy and Topic:  Group Therapy:  Change and Accountability  Participation Level:  Did Not Attend  Description of Group In this group, patients discussed power and accountability for change.  The group identified the challenges related to accountability and the difficulty of accepting the outcomes of negative behaviors.  Patients were encouraged to openly discuss a challenge/change they could take responsibility for.  Patients discussed the use of "change talk" and positive thinking as ways to support achievement of personal goals.  The group discussed ways to give support and empowerment to peers.  Therapeutic Goals: 1. Patients will state the relationship between personal power and accountability in the change process 2. Patients will identify the positive and negative consequences of a personal choice they have made 3. Patients will identify one challenge/choice they will take responsibility for making 4. Patients will discuss the role of "change talk" and the impact of positive thinking as it supports successful personal change 5. Patients will verbalize support and affirmation of change efforts in peers  Summary of Patient Progress:    Therapeutic Modalities Solution Focused Brief Therapy Motivational Interviewing Cognitive Behavioral Therapy  Carlynn Heraldngel M Sonali Wivell, Student-Social Work 12/25/2016 12:56 PM

## 2016-12-25 NOTE — Progress Notes (Signed)
Recreation Therapy Notes  Date: 12/25/16 Time: 1015 Location: [address] Dayroom  Group Topic: Communication  Goal Area(s) Addresses:  Patient will effectively communicate with peers in group.  Patient will verbalize benefit of healthy communication. Patient will verbalize positive effect of healthy communication on post d/c goals.  Patient will identify communication techniques that made activity effective for group.   Intervention:  Geometrical drawings, pencils, blank paper  Activity: Back to Back Drawings.  Patients were divided into groups of 2.  One person gave the instructions and the other drew the picture according to the instructions given.  The person drawing was not allowed to ask any questions of the person giving the directions.  Patients switched roles in the second round.   Education: Communication, Discharge Planning  Education Outcome: Acknowledges understanding/In group clarification offered/Needs additional education.   Clinical Observations/Feedback: Pt did not attend group.    Caroll RancherMarjette Torren Maffeo, LRT/CTRS         Lillia AbedLindsay, Greysin Medlen A 12/25/2016 12:00 PM

## 2016-12-26 DIAGNOSIS — F1994 Other psychoactive substance use, unspecified with psychoactive substance-induced mood disorder: Secondary | ICD-10-CM

## 2016-12-26 MED ORDER — SERTRALINE HCL 50 MG PO TABS: 50.0000 mg | ORAL_TABLET | Freq: Every day | ORAL | 0 refills | Status: DC

## 2016-12-26 MED ORDER — HYDROCHLOROTHIAZIDE 12.5 MG PO CAPS: 12.5000 mg | ORAL_CAPSULE | Freq: Every day | ORAL | 0 refills | Status: DC

## 2016-12-26 MED ORDER — TRAZODONE HCL 50 MG PO TABS
50.0000 mg | ORAL_TABLET | Freq: Every day | ORAL | Status: DC
  Filled 2016-12-26: qty 1

## 2016-12-26 MED ORDER — TRAZODONE HCL 50 MG PO TABS: ORAL_TABLET | ORAL | 0 refills | Status: DC

## 2016-12-26 MED ORDER — NICOTINE 21 MG/24HR TD PT24: 21.0000 mg | MEDICATED_PATCH | Freq: Every day | TRANSDERMAL | 0 refills | Status: DC

## 2016-12-26 MED ORDER — ARIPIPRAZOLE 10 MG PO TABS: 10.0000 mg | ORAL_TABLET | Freq: Every day | ORAL | 0 refills | Status: DC

## 2016-12-26 MED ORDER — HYDROXYZINE HCL 25 MG PO TABS: ORAL_TABLET | ORAL | 0 refills | Status: DC

## 2016-12-26 NOTE — Plan of Care (Signed)
Pt did not attend groups.   Hipolito Martinezlopez, LRT/CTRS 

## 2016-12-26 NOTE — Discharge Summary (Signed)
Physician Discharge Summary Note  Patient:  Matthew Kaufman is an 46 y.o., male MRN:  [id number] DOB:  April 03, 1970 Patient phone:  [phone] (home)  Patient address:   [address] [place] [address],  Total Time spent with patient: Greater than 30 minutes  Date of Admission:  12/23/2016 Date of Discharge: 12-26-16  Reason for Admission: Worsening symptoms of Schizophrenia & illicit use of multiple drugs.  Principal Problem: Schizophrenia [facility])  Discharge Diagnoses: Patient Active Problem List   Diagnosis Date Noted  . Substance induced mood disorder (HCC) [F19.94] 12/23/2016  . Schizophrenia (HCC) [F20.9] 12/23/2016  . Alcohol use disorder, severe, dependence (HCC) [F10.20] 12/23/2016  . HTN (hypertension) [I10] 05/08/2016  . Depression with anxiety [F41.8] 05/08/2016  . Insomnia [G47.00] 05/08/2016  . Substance abuse (HCC) [F19.10] 05/08/2016  . Cracked tooth [K03.81] 05/08/2016  . Acne [L70.9] 05/08/2016   Past Psychiatric History: Substance induced mood disorder.  Past Medical History:  Past Medical History:  Diagnosis Date  . Hypertension     Past Surgical History:  Procedure Laterality Date  . APPENDECTOMY     Family History: History reviewed. No pertinent family history. Family Psychiatric  History: See H&P Social History:  Social History   Substance and Sexual Activity  Alcohol Use Yes  . Alcohol/week: 3.6 oz  . Types: 6 Glasses of wine per week     Social History   Substance and Sexual Activity  Drug Use Yes  . Types: Marijuana, Cocaine    Social History   Socioeconomic History  . Marital status: Married    Spouse name: None  . Number of children: None  . Years of education: None  . Highest education level: None  Social Needs  . Financial resource strain: None  . Food insecurity - worry: None  . Food insecurity - inability: None  . Transportation needs - medical: None  . Transportation needs - non-medical: None  Occupational  History  . None  Tobacco Use  . Smoking status: Current Every Day Smoker    Packs/day: 0.50    Types: Cigarettes  . Smokeless tobacco: Never Used  Substance and Sexual Activity  . Alcohol use: Yes    Alcohol/week: 3.6 oz    Types: 6 Glasses of wine per week  . Drug use: Yes    Types: Marijuana, Cocaine  . Sexual activity: None  Other Topics Concern  . None  Social History Narrative  . None   Hospital Course: Matthew Kaufman is a 46 y/o M with history of schizophrenia who presented voluntarily with worsening depression, SI with plan to cut his wrist, anxiety, and substance use of multiple illicit substances. Pt shares his reasons for presenting to [facility], stating, "I was at the pont of no return - I never cut my wrist before." Pt reports depressive symptoms of anhedonia, guilty feelings, low energy, poor concentration, poor appetite, low motivation, and hopelessness. He denies symptoms of OCD, mania, and PTSD. He endorses SI with plan to cut his wrist. He denies HI. He endorses CAH which told him that people are after him and to cut himself to "release the pain." He denies VH. Pt reports that has stressors of strained relationship with his girlfriend with whom he lives. Pt moved to this area from [place] about 7 months ago after the death of his sister, and he has remained in the area. He started a relationship with a woman and moved in with her, but pt reports that he would like to end the  relationship because his girlfriend has been a bad influence in terms of substance use. Pt reports that he has been using cannabis daily, cocaine whenever it is available, and alcohol about 2 fifths of wine daily and 6 beers. Pt has also tried ecstasy when it is available. Pt would like to stop using substances and seek treatment for his problem with dependency. He states, "I think a 90-day program or something like that would be best for me."   Matthew Kaufman was admitted to the hospital with a blood alcohol level of  <10 per toxicology tests reports and his UDS test reports was positive for cocaine & THC. He was also complaining of worsening mood mood instability, suicidal ideations with plans to cut his wrist. He was in need of drug detoxification as well as mood stabilization treatments. His detoxification treatments was achieved using Librium detox protocols.  Besides the detoxification treatments, Matthew Kaufman was also medicated and discharged on; Abilify 10 mg for mood control, Hydroxyzine 25 mg prn for anxiety, Nicotine patch 21 mg for smoking cessation, Sertraline 50 mg fir depression & Trazodone 50 mg for insomnia. He was also resumed on all his pertinent home medications for his other pre-existing medical issues that he presented. He tolerated his treatment regimen without any significant adverse effects and or reactions. He was also enrolled and participated in the group counseling sessions and AA/NA meetings being offered and held on this unit. He learned coping skills that should help him cope better after discharge to maintain sobriety & mood stability.   During the course of his hospitalization, Matthew Kaufman was noted to be motivated for recovery. He worked closely with the treatment team and case managers to develop a discharge plan with appropriate goals to maintain mood stability & sobriety after discharge. He has completed detox treatment and his mood is also stabilized. This is evidenced by his reports of improved mood, absence of suicidal ideations and or substance withdrawals symptoms. He is currently ready to be discharged to continue further substance abuse treatment at the [facility] [place], [place]. He was provided with all pertinent information needed to make this appointment without problems.  Upon discharge Matthew Kaufman was in much improved condition than upon admission. His symptoms were reported as significantly decreased or resolved completely. He currently denies any SI/HI, AVH, delusional thoughts and or  paranoia. He was motivated to continue taking medications with a goal of continued improvement in mental health.  He was provided with 21 days worth supply samples of his [facility]. [facility] in no apparent distress Transportation [facility].  Physical Findings: AIMS: Facial and Oral Movements Muscles of Facial Expression: None, normal Lips and Perioral Area: None, normal Jaw: None, normal Tongue: None, normal,Extremity Movements Upper (arms, wrists, hands, fingers): None, normal Lower (legs, knees, ankles, toes): None, normal, Trunk Movements Neck, shoulders, hips: None, normal, Overall Severity Severity of abnormal movements (highest score from questions above): None, normal Incapacitation due to abnormal movements: None, normal Patient's awareness of abnormal movements (rate only patient's report): No Awareness, Dental Status Current problems with teeth and/or dentures?: No Does patient usually wear dentures?: No  CIWA:  CIWA-Ar Total: 2 COWS:     Musculoskeletal: Strength & Muscle Tone: within normal limits Gait & Station: normal Patient leans: N/A  Psychiatric Specialty Exam: Physical Exam  Constitutional: He appears well-developed.  HENT:  Head: Normocephalic.    ROS  Blood pressure (!) 136/92, pulse 82, temperature 99 F (37.2 C), temperature source  Oral, resp. rate 20, height 5\' 9"  (1.753 m), weight 92.5 kg (204 lb).Body mass index is 30.13 kg/m.  See Md's SRA  Have you used any form of tobacco in the last 30 days? (Cigarettes, Smokeless Tobacco, Cigars, and/or Pipes): Yes  Has this patient used any form of tobacco in the last 30 days? (Cigarettes, Smokeless Tobacco, Cigars, and/or Pipes): Yes, recommended Nicotine patch prescription 21 mg for smoking cessation.  Blood Alcohol level:  Lab Results  Component Value Date   ETH <10 12/23/2016   Metabolic Disorder Labs:  Lab Results  Component Value Date   HGBA1C 6.3  (H) 12/23/2016   MPG 134.11 12/23/2016   Lab Results  Component Value Date   PROLACTIN 15.9 (H) 12/23/2016   Lab Results  Component Value Date   CHOL 171 12/23/2016   TRIG 111 12/23/2016   HDL 64 12/23/2016   CHOLHDL 2.7 12/23/2016   VLDL 22 12/23/2016   LDLCALC 85 12/23/2016   LDLCALC 122 (H) 05/08/2016   See Psychiatric Specialty Exam and Suicide Risk Assessment completed by Attending Physician prior to discharge.  Discharge destination:  Home  Is patient on multiple antipsychotic therapies at discharge:  No   Has Patient had three or more failed trials of antipsychotic monotherapy by history:  No  Recommended Plan for Multiple Antipsychotic Therapies: NA  Allergies as of 12/26/2016      Reactions   Amoxicillin Hives   Has patient had a PCN reaction causing immediate rash, facial/tongue/throat swelling, SOB or lightheadedness with hypotension: Yes Has patient had a PCN reaction causing severe rash involving mucus membranes or skin: yes Has patient had a PCN reaction that required hospitalization: No Has patient had a PCN reaction occurring within the last 10 years: No If all of the above answers are "NO", then may proceed with Cephalosporin use.   Doxycycline Hives      Medication List    STOP taking these medications   hydrochlorothiazide 12.5 MG tablet Commonly known as:  HYDRODIURIL Replaced by:  hydrochlorothiazide 12.5 MG capsule   Melatonin 5 MG Tabs   Salicylic Acid 2 % Pads     TAKE these medications     Indication  ARIPiprazole 10 MG tablet Commonly known as:  ABILIFY Take 1 tablet (10 mg total) daily by mouth. For mood control Start taking on:  12/27/2016  Indication:  Mood control   hydrochlorothiazide 12.5 MG capsule Commonly known as:  MICROZIDE Take 1 capsule (12.5 mg total) daily by mouth. For high blood pressure Start taking on:  12/27/2016 Replaces:  hydrochlorothiazide 12.5 MG tablet  Indication:  High Blood Pressure Disorder    hydrOXYzine 25 MG tablet Commonly known as:  ATARAX/VISTARIL Take 1 tablet (25 mg by mouth four times daily as needed: For anxiety  Indication:  Feeling Anxious   nicotine 21 mg/24hr patch Commonly known as:  NICODERM CQ - dosed in mg/24 hours Place 1 patch (21 mg total) daily onto the skin. For smoking cessation Start taking on:  12/27/2016  Indication:  Nicotine Addiction   sertraline 50 MG tablet Commonly known as:  ZOLOFT Take 1 tablet (50 mg total) daily by mouth. For depression Start taking on:  12/27/2016 What changed:  additional instructions  Indication:  Major Depressive Disorder   traZODone 50 MG tablet Commonly known as:  DESYREL Take 1 tablet (50 mg) by mouth at bedtime: sleep  Indication:  Trouble Sleeping      Follow-up Information    [facility]  Follow up on 12/26/2016.   Specialty:  Addiction Medicine Why:  They will pick you up at 1:00 today. Contact information: [address] [place] [address] [phone]          Follow-up recommendations: Activity:  As tolerated Diet: As recommended by your primary care doctor. Keep all scheduled follow-up appointments as recommended.    Comments: Patient is instructed prior to discharge to: Take all medications as prescribed by his/her mental healthcare provider. Report any adverse effects and or reactions from the medicines to his/her outpatient provider promptly. Patient has been instructed & cautioned: To not engage in alcohol and or illegal drug use while on prescription medicines. In the event of worsening symptoms, patient is instructed to call the crisis hotline, 911 and or go to the nearest ED for appropriate evaluation and treatment of symptoms. To follow-up with his/her primary care provider for your other medical issues, concerns and or health care needs.   Signed: Sanjuana KavaNwoko, Agnes I, NP, PMHNP, FNP-BC 12/26/2016, 4:03 PM   Patient seen, Suicide Assessment  Completed.  Disposition Plan Reviewed   Matthew Kaufman is a 46 y/o M with history of schizophrenia and alcohol use disorder who presented with worsening depression, psychosis, and alcohol use. He was placed on CIWA protocol with librium and restarted on psychotropic medications including abilify and zoloft. Today upon evaluation, pt reports that he feels mildly fatigued but overall he is doing well. He denies SI/HI/AH/VH. He is future oriented about completing [facility] and he feels that he is ready to go today. He denies any problems or side effects with his medications. He is in agreement to continue his current regimen without changes. He agrees to follow up with psychiatrist on an outpatient basis. He is able to engage in safety planning including to call 911 or return to [facility] if he feels unable to maintain his own safety. Pt had no further questions, comments, or concerns.   Plan Of Care/Follow-up recommendations:  -Discharge to outpatient level of care (pt will DC directly to [facility]) - Schizophrenia -Continueabilify 10mg  qDay -Continuezoloft 50mg  qDay  Activity:  as tolerate Diet:  normal Tests:  NA Other:  see above for DC plan  Micheal Likenshristopher T Darbie Biancardi, MD

## 2016-12-26 NOTE — [facility] Suicide Risk Assessment (Signed)
[facility]   Principal Problem: Schizophrenia [facility](HCC) Discharge Diagnoses:  Patient Active Problem List   Diagnosis Date Noted  . Substance induced mood disorder (HCC) [F19.94] 12/23/2016  . Schizophrenia (HCC) [F20.9] 12/23/2016  . Alcohol use disorder, severe, dependence (HCC) [F10.20] 12/23/2016  . HTN (hypertension) [I10] 05/08/2016  . Depression with anxiety [F41.8] 05/08/2016  . Insomnia [G47.00] 05/08/2016  . Substance abuse (HCC) [F19.10] 05/08/2016  . Cracked tooth [K03.81] 05/08/2016  . Acne [L70.9] 05/08/2016    Total Time spent with patient: 30 minutes  Musculoskeletal: Strength & Muscle Tone: within normal limits Gait & Station: normal Patient leans: N/A  Psychiatric Specialty Exam: Review of Systems  Constitutional: Negative for chills and fever.  Cardiovascular: Negative for chest pain and palpitations.  Gastrointestinal: Negative for heartburn and nausea.  Psychiatric/Behavioral: Negative for depression, hallucinations and suicidal ideas. The patient is not nervous/anxious.     Blood pressure (!) 136/92, pulse 82, temperature 99 F (37.2 C), temperature source Oral, resp. rate 20, height 5\' 9"  (1.753 m), weight 92.5 kg (204 lb).Body mass index is 30.13 kg/m.  General Appearance: Casual and Fairly Groomed  Patent attorneyye Contact::  Good  Speech:  Clear and Coherent and Normal Rate  Volume:  Normal  Mood:  Euthymic  Affect:  Appropriate, Congruent and Full Range  Thought Process:  Coherent and Goal Directed  Orientation:  Full (Time, Place, and Person)  Thought Content:  Logical  Suicidal Thoughts:  No  Homicidal Thoughts:  No  Memory:  Immediate;   Fair Recent;   Fair Remote;   Fair  Judgement:  Fair  Insight:  Fair  Psychomotor Activity:  Normal  Concentration:  Good  Recall:  Good  Fund of Knowledge:Good  Language: Fair  Akathisia:  No  Handed:    AIMS (if indicated):     Assets:  Manufacturing systems engineerCommunication Skills Physical  Health Resilience Social Support  Sleep:  Number of Hours: 6.5  Cognition: WNL  ADL's:  Intact   Mental Status Per Nursing Assessment::   On Admission:     Demographic Factors:  Male, Low socioeconomic status and Unemployed  Loss Factors: NA  Historical Factors: Family history of mental illness or substance abuse  Risk Reduction Factors:   Positive social support, Positive therapeutic relationship and Positive coping skills or problem solving skills  Continued Clinical Symptoms:  Alcohol/Substance Abuse/Dependencies Schizophrenia:   Paranoid or undifferentiated type  Cognitive Features That Contribute To Risk:  None    Suicide Risk:  Minimal: No identifiable suicidal ideation.  Patients presenting with no risk factors but with morbid ruminations; may be classified as minimal risk based on the severity of the depressive symptoms  Follow-up Information    [facility] Follow up on 12/26/2016.   Specialty:  Addiction Medicine Why:  They will pick you up at 1:00 today. Contact information: [address] [place] [address] [phone]         Subjective Data: Matthew AlmasDonald Kaufman is a 46 y/o M with history of schizophrenia and alcohol use disorder who presented with worsening depression, psychosis, and alcohol use. He was placed on CIWA protocol with librium and restarted on psychotropic medications including abilify and zoloft. Today upon evaluation, pt reports that he feels mildly fatigued but overall he is doing well. He denies SI/HI/AH/VH. He is future oriented about [facility] and he feels that he is ready to go today. He denies any problems or side effects with his medications. He is  in agreement to continue his current regimen without changes. He agrees to follow up with psychiatrist on an outpatient basis. He is able to engage in safety planning including to call 911 or [facility] if he feels unable to maintain his  own safety. Pt had no further questions, comments, or concerns.   Plan Of Care/Follow-up recommendations:  -Discharge to outpatient level of care (pt will DC directly to [facility]) - Schizophrenia -Continueabilify 10mg  qDay -Continuezoloft 50mg  qDay  Activity:  as tolerate Diet:  normal Tests:  NA Other:  see above for DC plan  Micheal Likenshristopher T Kaelani Kendrick, MD 12/26/2016, 11:16 AM

## 2016-12-26 NOTE — Progress Notes (Signed)
Recreation Therapy Notes  Date: 12/26/16 Time: 0945 Location: [address] Dayroom  Group Topic: Self-Expression  Goal Area(s) Addresses:  Patient will successfully identify positive attributes about themselves.  Patient will successfully identify benefit of improved self-expression.   Intervention: None  Activity: Show Your Talent.  Patients were encouraged to share a talent they have with the group.  Patients could sing, dance, do poetry, draw, etc.  Education:  Self-Expression, Discharge Planning.   Education Outcome: Acknowledges education/In group clarification offered/Needs additional education  Clinical Observations/Feedback: Pt did not attend group.   Caroll RancherMarjette Meliyah Simon, LRT/CTRS         Caroll RancherLindsay, Jaquavion Mccannon A 12/26/2016 11:24 AM

## 2016-12-26 NOTE — Progress Notes (Addendum)
D: Pt presents with flat affect and depressed mood. Denies SI, HI, AVH and pain when assessed. Rates his depression 6/10, hopelessness 6/10 and anxiety 3/10. [facility] per MD's orders. Per pt "I'm just ready to get on with the next step". Pt attended scheduled unit groups and was verbally engaged. Pt was picked up in the lobby by [employer] representative.  A: Scheduled medications administered as prescribed with verbal education and effects monitored. Discharge instructions reviewed with pt including prescriptions and medication samples. Pt encouraged to comply with d/c instructions.  All belongings from locker 55 returned to pt at time of d/c. Support and availability provided. Q 15 minutes safety checks continued till d/c without self harm gestures or outburst.  R: Pt receptive to care. Cooperative with d/c procedure. Compliant with medications when offered. Denies concerns or active withdrawals symptoms at this time. Verbalized understanding related to d/c instructions. Signed belonging sheet in agreement with items received. Ambulatory with a steady gait. Appears to be in no physical distress at time of departure.

## 2016-12-26 NOTE — Progress Notes (Addendum)
  [facility] Adult Case Management Discharge Plan :  Will you be returning to the same living situation after discharge:  No. At discharge, do you have transportation home?: Yes,  [facility] Do you have the ability to pay for your medications: Yes,  Sent with 3 week supply  Release of information consent forms completed and in the chart;  Patient's signature needed at discharge.  Patient to Follow up at:  [facility] [address]  [place] [phone] They will pick you up today, Friday, 12/26/16 at 1PM   Next level of care provider has access to [facility] Link:no  Safety Planning and Suicide Prevention discussed: Yes,  yes  Have you used any form of tobacco in the last 30 days? (Cigarettes, Smokeless Tobacco, Cigars, and/or Pipes): Yes  Has patient been referred to the Quitline?: Patient refused referral  Patient has been referred for addiction treatment: Yes  Ida RogueRodney B Markeria Goetsch, LCSW 12/26/2016, 11:11 AM

## 2016-12-26 NOTE — Progress Notes (Signed)
Nursing Progress Note: 7p-7a D: Pt currently presents with a depressed/minimal affect and behavior. Pt states "I am fine. I want to go." Interacting minimal with the milieu. Pt reports good sleep during the previous night with current medication regimen.   A: Pt provided with medications per providers orders. Pt's labs and vitals were monitored throughout the night. Pt supported emotionally and encouraged to express concerns and questions. Pt educated on medications.  R: Pt's safety ensured with 15 minute and environmental checks. Pt currently denies SI, HI, and AVH. Pt verbally contracts to seek staff if SI,HI, or AVH occurs and to consult with staff before acting on any harmful thoughts. Will continue to monitor.

## 2016-12-26 NOTE — Progress Notes (Signed)
Recreation Therapy Notes  INPATIENT RECREATION TR PLAN  Patient Details Name: Matthew Kaufman MRN: [id number] DOB: Dec 23, 1970 Today's Date: 12/26/2016  Rec Therapy Plan Is patient appropriate for Therapeutic Recreation?: Yes Treatment times per week: about 3 days Estimated Length of Stay: 5-7 days TR Treatment/Interventions: Group participation (Comment)  Discharge Criteria Pt will be discharged from therapy if:: Discharged Treatment plan/goals/alternatives discussed and agreed upon by:: Patient/family  Discharge Summary Short term goals set: Pt will identify coping skills for suicidal thoughts. Short term goals met: Not met Reason goals not met: Pt did not attend groups. Therapeutic equipment acquired: N/A Reason patient discharged from therapy: Discharge from hospital Pt/family agrees with progress & goals achieved: Yes Date patient discharged from therapy: 12/26/16    Victorino Sparrow, LRT/CTRS  Ria Comment, Abid Bolla A 12/26/2016, 12:16 PM

## 2017-04-25 ENCOUNTER — Encounter (HOSPITAL_COMMUNITY): Payer: Self-pay | Admitting: Emergency Medicine

## 2017-04-25 ENCOUNTER — Emergency Department (HOSPITAL_COMMUNITY)
Admission: EM | Admit: 2017-04-25 | Discharge: 2017-04-25 | Disposition: A | Discharge status: Home / Self Care | Payer: Self-pay | Attending: Emergency Medicine | Admitting: Emergency Medicine

## 2017-04-25 ENCOUNTER — Other Ambulatory Visit: Payer: Self-pay

## 2017-04-25 DIAGNOSIS — I10 Essential (primary) hypertension: Secondary | ICD-10-CM | POA: Insufficient documentation

## 2017-04-25 DIAGNOSIS — Z79899 Other long term (current) drug therapy: Secondary | ICD-10-CM | POA: Insufficient documentation

## 2017-04-25 DIAGNOSIS — F17210 Nicotine dependence, cigarettes, uncomplicated: Secondary | ICD-10-CM | POA: Insufficient documentation

## 2017-04-25 DIAGNOSIS — F10920 Alcohol use, unspecified with intoxication, uncomplicated: Secondary | ICD-10-CM | POA: Insufficient documentation

## 2017-04-25 LAB — CBG MONITORING, ED: Glucose-Capillary: 76 mg/dL (ref 65–99)

## 2017-04-25 LAB — ETHANOL: Alcohol, Ethyl (B): 201 mg/dL — ABNORMAL HIGH (ref <10)

## 2017-04-25 NOTE — ED Notes (Signed)
Bed: WA15 Expected date:  Expected time:  Means of arrival:  Comments: EMS  
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
## 2017-04-25 NOTE — ED Notes (Signed)
Patient found at the end of the bed, patient urinated on floor. Patient cleaned up and placed back in the bed.
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
## 2017-04-25 NOTE — ED Notes (Signed)
He arouses slowly and we issue blue scrub pants. Security notified to escort him from property. He ambulates without problem. He has eaten two sandwiches and has drank soda without difficulty.

## 2017-04-25 NOTE — ED Notes (Signed)
Pt states he does not know his height or weight right now. Not steady enough to stand up to weigh or measure height at this time.

## 2017-08-07 ENCOUNTER — Ambulatory Visit (HOSPITAL_COMMUNITY)
Admission: EM | Admit: 2017-08-07 | Discharge: 2017-08-07 | Disposition: A | Discharge status: Home / Self Care | Payer: Self-pay | Attending: Internal Medicine | Admitting: Internal Medicine

## 2017-08-07 ENCOUNTER — Encounter (HOSPITAL_COMMUNITY): Payer: Self-pay

## 2017-08-07 DIAGNOSIS — I10 Essential (primary) hypertension: Secondary | ICD-10-CM

## 2017-08-07 MED ORDER — CLONIDINE HCL 0.1 MG PO TABS
0.1000 mg | ORAL_TABLET | Freq: Once | ORAL | Status: AC
  Administered 2017-08-07: 0.1 mg via ORAL

## 2017-08-07 MED ORDER — HYDROCHLOROTHIAZIDE 12.5 MG PO CAPS: 12.5000 mg | ORAL_CAPSULE | Freq: Every day | ORAL | 0 refills | Status: DC

## 2017-08-07 MED ORDER — CLONIDINE HCL 0.1 MG PO TABS
ORAL_TABLET | ORAL | Status: AC
  Filled 2017-08-07: qty 1

## 2017-08-07 NOTE — Discharge Instructions (Addendum)
Prescription for HCTZ (blood pressure med) was sent to the pharmacy.  Dose of clonidine 0.1 mg was given at the urgent care today for blood pressure >140/90.  Verification that it is safe for you to donate plasma regularly will need to come from your primary care provider and will likely depend on your being able to commit to a blood pressure treatment program, with followup visits for care of high blood pressure.  I am not able to clear you to donate plasma today.

## 2017-08-07 NOTE — ED Provider Notes (Signed)
[facility]    CSN: [id number] Arrival date & time: 08/07/17  1050     History   Chief Complaint No chief complaint on file.   HPI Matthew Kaufman is a 47 y.o. male.   He presents today, referred from the plasma center for medical clearance.  He has presented there recently on several occasions, and been found to have blood pressure that prohibits him from donating plasma.  He does have a diagnosis of hypertension, and last saw his PCP in March 2018.    HPI  Past Medical History:  Diagnosis Date  . Hypertension     Patient Active Problem List   Diagnosis Date Noted  . Substance induced mood disorder (HCC) 12/23/2016  . Schizophrenia (HCC) 12/23/2016  . Alcohol use disorder, severe, dependence (HCC) 12/23/2016  . HTN (hypertension) 05/08/2016  . Depression with anxiety 05/08/2016  . Insomnia 05/08/2016  . Substance abuse (HCC) 05/08/2016  . Cracked tooth 05/08/2016  . Acne 05/08/2016    Past Surgical History:  Procedure Laterality Date  . APPENDECTOMY         Home Medications    Prior to Admission medications   Medication Sig Start Date End Date Taking? Authorizing Provider  ARIPiprazole (ABILIFY) 10 MG tablet Take 1 tablet (10 mg total) daily by mouth. For mood control 12/27/16   Matthew StammerNwoko, Agnes I, NP  hydrochlorothiazide (MICROZIDE) 12.5 MG capsule Take 1 capsule (12.5 mg total) by mouth daily. For high blood pressure 08/07/17   Matthew RankinMurray, Laura Wilson, MD  hydrOXYzine (ATARAX/VISTARIL) 25 MG tablet Take 1 tablet (25 mg by mouth four times daily as needed: For anxiety 12/26/16   Matthew StammerNwoko, Agnes I, NP  nicotine (NICODERM CQ - DOSED IN MG/24 HOURS) 21 mg/24hr patch Place 1 patch (21 mg total) daily onto the skin. For smoking cessation 12/27/16   Matthew StammerNwoko, Agnes I, NP  sertraline (ZOLOFT) 50 MG tablet Take 1 tablet (50 mg total) daily by mouth. For depression 12/27/16   Matthew StammerNwoko, Agnes I, NP  traZODone (DESYREL) 50 MG tablet Take 1 tablet (50 mg) by mouth at bedtime:  sleep 12/26/16   Matthew KavaNwoko, Agnes I, NP    Family History No family history on file.  Social History Social History   Tobacco Use  . Smoking status: Current Every Day Smoker    Packs/day: 0.50    Types: Cigarettes  . Smokeless tobacco: Never Used  Substance Use Topics  . Alcohol use: Yes    Alcohol/week: 3.6 oz    Types: 6 Glasses of wine per week  . Drug use: Yes    Types: Marijuana, Cocaine     Allergies   Amoxicillin and Doxycycline   Review of Systems Review of Systems  All other systems reviewed and are negative.    Physical Exam Triage Vital Signs ED Triage Vitals  Enc Vitals Group     BP 08/07/17 1121 (!) 138/93     Pulse Rate 08/07/17 1121 (!) 104     Resp 08/07/17 1121 20     Temp 08/07/17 1121 98 F (36.7 C)     Temp Source 08/07/17 1121 Oral     SpO2 08/07/17 1121 100 %     Weight --      Height --      Pain Score 08/07/17 1129 0     Pain Loc --    Updated Vital Signs BP (!) 146/106   Pulse (!) 104   Temp 98 F (36.7 C) (Oral)   Resp 20  SpO2 100%  Physical Exam  Constitutional: He is oriented to person, place, and time. No distress.  Alert, nicely groomed  HENT:  Head: Atraumatic.  Eyes:  Conjugate gaze, no eye redness/drainage  Neck: Neck supple.  Cardiovascular: Normal rate and regular rhythm.  Pulmonary/Chest: No respiratory distress. He has no wheezes. He has no rales.  Lungs clear, symmetric breath sounds  Abdominal: He exhibits no distension.  Musculoskeletal: Normal range of motion. He exhibits no edema.  Neurological: He is alert and oriented to person, place, and time.  Skin: Skin is warm and dry.  No cyanosis  Nursing note and vitals reviewed.    UC Treatments / Results  Labs (all labs ordered are listed, but only abnormal results are displayed) Labs Reviewed - No data to display  EKG None  Radiology No results found.  Procedures Procedures (including critical care time)  Medications Ordered in UC Medications   cloNIDine (CATAPRES) tablet 0.1 mg (0.1 mg Oral Given 08/07/17 1209)    Final Clinical Impressions(s) / UC Diagnoses   Final diagnoses:  Elevated blood pressure reading with diagnosis of hypertension     Discharge Instructions     Prescription for HCTZ (blood pressure med) was sent to the pharmacy.  Dose of clonidine 0.1 mg was given at the urgent care today for blood pressure >140/90.  Verification that it is safe for you to donate plasma regularly will need to come from your primary care provider and will likely depend on your being able to commit to a blood pressure treatment program, with followup visits for care of high blood pressure.  Kaufman am not able to clear you to donate plasma today.   ED Prescriptions    Medication Sig Dispense Auth. Provider   hydrochlorothiazide (MICROZIDE) 12.5 MG capsule Take 1 capsule (12.5 mg total) by mouth daily. For high blood pressure 30 capsule Matthew Rankin, MD       Matthew Rankin, MD 08/09/17 [phone]

## 2017-08-07 NOTE — ED Triage Notes (Signed)
Pt presents from plasma center for elevated blood pressure.

## 2018-01-04 ENCOUNTER — Inpatient Hospital Stay (HOSPITAL_COMMUNITY)
Admission: RE | Admit: 2018-01-04 | Discharge: 2018-01-11 | DRG: 882 | Disposition: A | Discharge status: Home / Self Care | Payer: Federal, State, Local not specified - Other | Attending: Psychiatry | Admitting: Psychiatry

## 2018-01-04 ENCOUNTER — Encounter (HOSPITAL_COMMUNITY): Payer: Self-pay | Admitting: Emergency Medicine

## 2018-01-04 ENCOUNTER — Other Ambulatory Visit: Payer: Self-pay

## 2018-01-04 DIAGNOSIS — F17210 Nicotine dependence, cigarettes, uncomplicated: Secondary | ICD-10-CM | POA: Diagnosis present

## 2018-01-04 DIAGNOSIS — F10239 Alcohol dependence with withdrawal, unspecified: Secondary | ICD-10-CM | POA: Diagnosis not present

## 2018-01-04 DIAGNOSIS — R45850 Homicidal ideations: Secondary | ICD-10-CM | POA: Diagnosis present

## 2018-01-04 DIAGNOSIS — Z880 Allergy status to penicillin: Secondary | ICD-10-CM | POA: Diagnosis not present

## 2018-01-04 DIAGNOSIS — F1290 Cannabis use, unspecified, uncomplicated: Secondary | ICD-10-CM | POA: Diagnosis present

## 2018-01-04 DIAGNOSIS — F4310 Post-traumatic stress disorder, unspecified: Principal | ICD-10-CM

## 2018-01-04 DIAGNOSIS — Z915 Personal history of self-harm: Secondary | ICD-10-CM

## 2018-01-04 DIAGNOSIS — I10 Essential (primary) hypertension: Secondary | ICD-10-CM | POA: Diagnosis present

## 2018-01-04 DIAGNOSIS — Z881 Allergy status to other antibiotic agents status: Secondary | ICD-10-CM

## 2018-01-04 DIAGNOSIS — F1920 Other psychoactive substance dependence, uncomplicated: Secondary | ICD-10-CM

## 2018-01-04 DIAGNOSIS — J449 Chronic obstructive pulmonary disease, unspecified: Secondary | ICD-10-CM | POA: Diagnosis present

## 2018-01-04 DIAGNOSIS — Z79899 Other long term (current) drug therapy: Secondary | ICD-10-CM

## 2018-01-04 DIAGNOSIS — F1120 Opioid dependence, uncomplicated: Secondary | ICD-10-CM

## 2018-01-04 DIAGNOSIS — Z62810 Personal history of physical and sexual abuse in childhood: Secondary | ICD-10-CM | POA: Diagnosis present

## 2018-01-04 DIAGNOSIS — F419 Anxiety disorder, unspecified: Secondary | ICD-10-CM | POA: Diagnosis not present

## 2018-01-04 DIAGNOSIS — F3131 Bipolar disorder, current episode depressed, mild: Secondary | ICD-10-CM | POA: Diagnosis not present

## 2018-01-04 DIAGNOSIS — F3181 Bipolar II disorder: Secondary | ICD-10-CM | POA: Diagnosis present

## 2018-01-04 DIAGNOSIS — G4700 Insomnia, unspecified: Secondary | ICD-10-CM | POA: Diagnosis present

## 2018-01-04 DIAGNOSIS — F1010 Alcohol abuse, uncomplicated: Secondary | ICD-10-CM | POA: Diagnosis present

## 2018-01-04 MED ORDER — ADULT MULTIVITAMIN W/MINERALS CH
1.0000 | ORAL_TABLET | Freq: Every day | ORAL | Status: DC
  Administered 2018-01-05 – 2018-01-11 (×7): 1 via ORAL
  Filled 2018-01-04 (×11): qty 1

## 2018-01-04 MED ORDER — INFLUENZA VAC SPLIT QUAD 0.5 ML IM SUSY
0.5000 mL | PREFILLED_SYRINGE | INTRAMUSCULAR | Status: DC
  Filled 2018-01-04: qty 0.5

## 2018-01-04 MED ORDER — HYDROXYZINE HCL 25 MG PO TABS: 25.0000 mg | ORAL_TABLET | Freq: Four times a day (QID) | ORAL | Status: DC | PRN

## 2018-01-04 MED ORDER — VITAMIN B-1 100 MG PO TABS
100.0000 mg | ORAL_TABLET | Freq: Every day | ORAL | Status: DC
  Administered 2018-01-05 – 2018-01-11 (×7): 100 mg via ORAL
  Filled 2018-01-04 (×9): qty 1

## 2018-01-04 MED ORDER — HYDROXYZINE HCL 50 MG PO TABS
50.0000 mg | ORAL_TABLET | Freq: Four times a day (QID) | ORAL | Status: DC | PRN
  Administered 2018-01-07: 50 mg via ORAL
  Filled 2018-01-04: qty 30
  Filled 2018-01-04: qty 1

## 2018-01-04 MED ORDER — MAGNESIUM HYDROXIDE 400 MG/5ML PO SUSP: 30.0000 mL | Freq: Every day | ORAL | Status: DC | PRN

## 2018-01-04 MED ORDER — ALUM & MAG HYDROXIDE-SIMETH 200-200-20 MG/5ML PO SUSP: 30.0000 mL | ORAL | Status: DC | PRN

## 2018-01-04 MED ORDER — ONDANSETRON 4 MG PO TBDP: 4.0000 mg | ORAL_TABLET | Freq: Four times a day (QID) | ORAL | Status: AC | PRN

## 2018-01-04 MED ORDER — DIVALPROEX SODIUM 250 MG PO DR TAB
250.0000 mg | DELAYED_RELEASE_TABLET | Freq: Two times a day (BID) | ORAL | Status: DC
  Administered 2018-01-04 – 2018-01-08 (×8): 250 mg via ORAL
  Filled 2018-01-04 (×12): qty 1

## 2018-01-04 MED ORDER — LOPERAMIDE HCL 2 MG PO CAPS: 2.0000 mg | ORAL_CAPSULE | ORAL | Status: AC | PRN

## 2018-01-04 MED ORDER — THIAMINE HCL 100 MG/ML IJ SOLN
100.0000 mg | Freq: Once | INTRAMUSCULAR | Status: DC
  Filled 2018-01-04: qty 2

## 2018-01-04 MED ORDER — IBUPROFEN 800 MG PO TABS
800.0000 mg | ORAL_TABLET | Freq: Four times a day (QID) | ORAL | Status: DC | PRN
  Administered 2018-01-04 – 2018-01-11 (×15): 800 mg via ORAL
  Filled 2018-01-04 (×15): qty 1

## 2018-01-04 MED ORDER — OLANZAPINE 5 MG PO TABS
5.0000 mg | ORAL_TABLET | Freq: Every day | ORAL | Status: DC
  Filled 2018-01-04 (×3): qty 1

## 2018-01-04 MED ORDER — LORAZEPAM 1 MG PO TABS: 1.0000 mg | ORAL_TABLET | Freq: Four times a day (QID) | ORAL | Status: DC | PRN

## 2018-01-04 MED ORDER — ACETAMINOPHEN 325 MG PO TABS
650.0000 mg | ORAL_TABLET | Freq: Four times a day (QID) | ORAL | Status: DC | PRN
  Administered 2018-01-04 – 2018-01-10 (×6): 650 mg via ORAL
  Filled 2018-01-04 (×6): qty 2

## 2018-01-04 MED ORDER — TRAZODONE HCL 50 MG PO TABS
50.0000 mg | ORAL_TABLET | Freq: Every evening | ORAL | Status: DC | PRN
  Filled 2018-01-04: qty 21

## 2018-01-04 NOTE — H&P (Signed)
Behavioral Health Medical Screening Exam  Matthew AlmasDonald Kaufman is an 47 y.o. male.  Total Time spent with patient: 20 minutes  Psychiatric Specialty Exam: Physical Exam  Nursing note and vitals reviewed. Constitutional: He is oriented to person, place, and time. He appears well-developed and well-nourished.  Cardiovascular: Normal rate.  Respiratory: Effort normal.  Musculoskeletal: Normal range of motion.  Neurological: He is alert and oriented to person, place, and time.  Skin: Skin is warm.    Review of Systems  Constitutional: Negative.   HENT: Negative.   Eyes: Negative.   Respiratory: Negative.   Cardiovascular: Negative.   Gastrointestinal: Negative.   Genitourinary: Negative.   Musculoskeletal: Negative.   Skin: Negative.   Neurological: Negative.   Endo/Heme/Allergies: Negative.   Psychiatric/Behavioral: Positive for depression and substance abuse.       Homicidal ideations    Blood pressure (!) 142/92, pulse 84, temperature 98 F (36.7 C), SpO2 98 %.There is no height or weight on file to calculate BMI.  General Appearance: Disheveled  Eye Contact:  Fair  Speech:  Clear and Coherent and Normal Rate  Volume:  Decreased  Mood:  Depressed  Affect:  Depressed and Flat  Thought Process:  Linear and Descriptions of Associations: Intact  Orientation:  Full (Time, Place, and Person)  Thought Content:  WDL  Suicidal Thoughts:  No  Homicidal Thoughts:  Yes.  with intent/plan  Memory:  Immediate;   Good Recent;   Good Remote;   Good  Judgement:  Fair  Insight:  Fair  Psychomotor Activity:  Normal  Concentration: Concentration: Good and Attention Span: Good  Recall:  Good  Fund of Knowledge:Good  Language: Good  Akathisia:  No  Handed:  Right  AIMS (if indicated):     Assets:  Communication Skills Desire for Improvement Physical Health  Sleep:       Musculoskeletal: Strength & Muscle Tone: within normal limits Gait & Station: normal Patient leans:  N/A  Blood pressure (!) 142/92, pulse 84, temperature 98 F (36.7 C), SpO2 98 %.  Recommendations:  Based on my evaluation the patient does not appear to have an emergency medical condition.  Matthew Kaufman B Matthew Augusta, FNP 01/04/2018, 9:13 AM

## 2018-01-04 NOTE — Progress Notes (Signed)
Patient denied SI and HI, contracts for safety.  Denied A/V hallucinations.   EKG completed and put on MD desk for review. Patient given UA container for sample to be obtained per MD order. Patient has been cooperative and pleasant on the unit.

## 2018-01-04 NOTE — [facility] Suicide Risk Assessment (Signed)
[facility]   Nursing information obtained from:    Demographic factors:    Current Mental Status:    Loss Factors:    Historical Factors:    Risk Reduction Factors:     Total Time spent with patient: 1 hour Principal Problem: PTSD (post-traumatic stress disorder) Diagnosis:  Principal Problem:   PTSD (post-traumatic stress disorder) Active Problems:   Bipolar II disorder, severe, depressed, with anxious distress (HCC)   Polysubstance dependence including opioid type drug, episodic abuse (HCC)  Subjective Data: see H&P  Continued Clinical Symptoms:    The "Alcohol Use Disorders Identification Test", Guidelines for Use in Primary Care, Second Edition.  World Science writerHealth Organization [facility](WHO). Score between 0-7:  no or low risk or alcohol related problems. Score between 8-15:  moderate risk of alcohol related problems. Score between 16-19:  high risk of alcohol related problems. Score 20 or above:  warrants further diagnostic evaluation for alcohol dependence and treatment.   CLINICAL FACTORS:   Severe Anxiety and/or Agitation Bipolar Disorder:   Mixed State Alcohol/Substance Abuse/Dependencies More than one psychiatric diagnosis Unstable or Poor Therapeutic Relationship Previous Psychiatric Diagnoses and Treatments   Psychiatric Specialty Exam: Physical Exam  Nursing note and vitals reviewed.     Blood pressure (!) 142/92, pulse 84, temperature 98 F (36.7 C), SpO2 98 %.There is no height or weight on file to calculate BMI.     COGNITIVE FEATURES THAT CONTRIBUTE TO RISK:  None    SUICIDE RISK:   Mild:  Suicidal ideation of limited frequency, intensity, duration, and specificity.  There are no identifiable plans, no associated intent, mild dysphoria and related symptoms, good self-control (both objective and subjective assessment), few other risk factors, and identifiable protective factors, including available and accessible social support.  PLAN  OF CARE: see H&P  I certify that inpatient services furnished can reasonably be expected to improve the patient's condition.   Micheal Likenshristopher T Ronnell Clinger, MD 01/04/2018, 12:28 PM
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
## 2018-01-05 LAB — CBC
HEMATOCRIT: 41 % (ref 39.0–52.0)
HEMOGLOBIN: 12.9 g/dL — AB (ref 13.0–17.0)
MCH: 28.2 pg (ref 26.0–34.0)
MCHC: 31.5 g/dL (ref 30.0–36.0)
MCV: 89.7 fL (ref 80.0–100.0)
Platelets: 153 10*3/uL (ref 150–400)
RBC: 4.57 MIL/uL (ref 4.22–5.81)
RDW: 16.1 % — ABNORMAL HIGH (ref 11.5–15.5)
WBC: 7.7 10*3/uL (ref 4.0–10.5)
nRBC: 0 % (ref 0.0–0.2)

## 2018-01-05 LAB — RAPID URINE DRUG SCREEN, HOSP PERFORMED
Amphetamines: NOT DETECTED
BARBITURATES: NOT DETECTED
Benzodiazepines: NOT DETECTED
COCAINE: POSITIVE — AB
Opiates: NOT DETECTED
Tetrahydrocannabinol: POSITIVE — AB

## 2018-01-05 LAB — COMPREHENSIVE METABOLIC PANEL
ALK PHOS: 49 U/L (ref 38–126)
ALT: 24 U/L (ref 0–44)
AST: 36 U/L (ref 15–41)
Albumin: 3.6 g/dL (ref 3.5–5.0)
Anion gap: 10 (ref 5–15)
BUN: 18 mg/dL (ref 6–20)
CALCIUM: 8.4 mg/dL — AB (ref 8.9–10.3)
CO2: 24 mmol/L (ref 22–32)
Chloride: 108 mmol/L (ref 98–111)
Creatinine, Ser: 0.99 mg/dL (ref 0.61–1.24)
GFR calc Af Amer: >60 mL/min (ref >60)
GFR calc non Af Amer: >60 mL/min (ref >60)
GLUCOSE: 99 mg/dL (ref 70–99)
Potassium: 3.9 mmol/L (ref 3.5–5.1)
SODIUM: 142 mmol/L (ref 135–145)
Total Bilirubin: 0.7 mg/dL (ref 0.3–1.2)
Total Protein: 6.3 g/dL — ABNORMAL LOW (ref 6.5–8.1)

## 2018-01-05 LAB — LIPID PANEL
CHOL/HDL RATIO: 2.4 ratio
Cholesterol: 134 mg/dL (ref 0–200)
HDL: 55 mg/dL (ref >40)
LDL CALC: 68 mg/dL (ref 0–99)
Triglycerides: 57 mg/dL (ref <150)
VLDL: 11 mg/dL (ref 0–40)

## 2018-01-05 LAB — ETHANOL: Alcohol, Ethyl (B): <10 mg/dL (ref <10)

## 2018-01-05 LAB — HEMOGLOBIN A1C
Hgb A1c MFr Bld: 6.1 % — ABNORMAL HIGH (ref 4.8–5.6)
Mean Plasma Glucose: 128.37 mg/dL

## 2018-01-05 LAB — TSH: TSH: 0.479 u[IU]/mL (ref 0.350–4.500)
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
## 2018-01-05 NOTE — [facility] Group Notes (Signed)
[facility] Group Notes:  (Nursing/MHT/Case Management/Adjunct)  Date:  01/05/2018  Time:  4:00 pm  Type of Therapy:  Psychoeducational Skills  Participation Level:  Did Not Attend  Participation Quality:    Affect:    Cognitive:    Insight:    Engagement in Group:    Modes of Intervention:    Summary of Progress/Problems:  Earline MayotteKnight, Lyman Balingit Shephard 01/05/2018, 5:42 PM

## 2018-01-05 NOTE — Progress Notes (Signed)
[facility].  [facility] made a [facility] and will tell the DA for court tomorrow but this may or may not lead to a continuance.  [email or web address] days for a new court date.  Once discharged, he needs to come to clerk of court with his discharge paperwork and ask them to recall any order to arrest. Garner NashGregory Mackayla Mullins, MSW, LCSW Clinical Social Worker 01/05/2018 3:48 PM

## 2018-01-05 NOTE — Progress Notes (Signed)
D:  Patient's self inventory sheet, patient sleeps good, no sleep medication.  Fair appetite, low energy level, poor concentration.  Rated depression 8, hopeless 5, anxiety 7.                                          Withdrawals, chilling, runny nose, irritability.  Denied SI.  Physical pain, L knee, pain medication helpful.  Goal is not to get angry.  Plans to stay focused.  Plans to discharge to program. A:  Medications administered per MD orders.  Emotional support and encouragement given patient. R:  Denied SI and HI, contracts for safety.  Denied A/V hallucinations.  Safety maintained with 15 minute checks.

## 2018-01-05 NOTE — Plan of Care (Signed)
Nurse discussed anxiety, depression, coping skills with patient. 

## 2018-01-06 MED ORDER — CLONIDINE HCL 0.1 MG PO TABS
0.1000 mg | ORAL_TABLET | Freq: Once | ORAL | Status: AC
  Administered 2018-01-06: 0.1 mg via ORAL
  Filled 2018-01-06 (×2): qty 1

## 2018-01-06 MED ORDER — AMLODIPINE BESYLATE 5 MG PO TABS
5.0000 mg | ORAL_TABLET | Freq: Every day | ORAL | Status: DC
  Administered 2018-01-09 – 2018-01-11 (×3): 5 mg via ORAL
  Filled 2018-01-06 (×7): qty 1
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
## 2018-01-06 NOTE — BHH Group Notes (Signed)
BHH Group Notes:  (Nursing/MHT/Case Management/Adjunct)  Date:  01/06/2018  Time:  7:53 PM  Type of Therapy:  Nurse Education  Participation Level:  Active  Participation Quality:  Appropriate and Attentive  Affect:  Excited  Cognitive:  Alert and Appropriate  Insight:  Good  Engagement in Group:  Engaged  Modes of Intervention:  Discussion and Education  Summary of Progress/Problems: RN led group on "Needs Assessment." We discussed positive self affirmation, the triangle of "emotions," "actions," and "thoughts", and Maslow's hierarchy of needs. We discussed the importance of meeting needs in a healthy manner.   Kirstie MirzaJonathan C Chinmay Squier 01/06/2018, 7:53 PM

## 2018-01-06 NOTE — Progress Notes (Signed)
Patient did attend the evening speaker NA meeting. Pt was notified that group was beginning and returned to his room.  Pt did enter group after fifteen minutes.

## 2018-01-06 NOTE — Progress Notes (Signed)
Recreation Therapy Notes  Date: 11.20.19 Time: 0930 Location: 300 Hall Dayroom  Group Topic: Stress Management  Goal Area(s) Addresses:  Patient will verbalize importance of using healthy stress management.  Patient will identify positive emotions associated with healthy stress management.   Intervention: Stress Management  Activity :  Guided Imagery.  LRT introduced the stress management technique of guided imagery.  LRT read a script to guide patients through a peaceful meadow.  Patients were to follow along as script was read to engage in activity.  Education:  Stress Management, Discharge Planning.   Education Outcome: Acknowledges edcuation/In group clarification offered/Needs additional education  Clinical Observations/Feedback: Pt did not attend group.    Caroll RancherMarjette Robbi Spells, LRT/CTRS         Lillia AbedLindsay, Delores Thelen A 01/06/2018 11:22 AM

## 2018-01-06 NOTE — Therapy (Signed)
Occupational Therapy Group Note  Date:  01/06/2018 Time:  11:41 AM  Group Topic/Focus:  Stress Management  Participation Level:  Active  Participation Quality:  Appropriate  Affect:  Blunted  Cognitive:  Appropriate  Insight: Improving  Engagement in Group:  Engaged  Modes of Intervention:  Activity, Discussion, Education and Socialization  Additional Comments:    S: "I know when I get angry, I have to take a step back so I don't act on my irrational emotions, but this is more difficult when I am intoxicated."  O: Education given on stress management in reference to negative vs positive coping mechanisms. Various styles of coping strategies explored to help pt choose best to personally meet needs. Discussion facilitated and encouraged amongst group for sharing of personal experiences.  A: Pt presents to group with blunted affect, engaged and participatory throughout session. Pt shares that when he gets angry he has to step back and allow himself to think logically. He states that his takes more work as he is on his journey to sobriety. Pt also states that deep breathing is helpful at this time.  P: OT will continue to follow up while pt acute.  Dalphine HandingKaylee Tyus Kallam, MSOT, OTR/L Behavioral Health OT/ Acute Relief OT PHP Office: [phone]  Dalphine HandingKaylee Tracey Hermance 01/06/2018, 11:41 AM
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
## 2018-01-06 NOTE — Progress Notes (Signed)
Patient ID: Matthew AlmasDonald Kaufman, male   DOB: 01/23/1971, 47 y.o.   MRN: [id number]  D: Pt not motivated to engage in activities on the unit earlier in shift, signed a 72hour notice for discharge, and stated that he would like to be discharged because he has "two jobs lined up".  Pt was anxious, his blood pressure was 182/116, HR-62, manually was 180/100.  Night shift provider was notified, order was placed for Clonidine 0.1mg  which was given to pt.  Pt refused an offer of Trazodone for insomnia and Atarax for anxiety, stating that he does not want to take medications because "they mess me up". Education on medications provided, yet pt refused. Pt denies SI/HI/AVH, and was observed in the day room interacting with his peers earlier in the shift.  A: Pt was his scheduled dose of Depakote, and is currently in bed resting with no signs of distress.  Q15 minute safety checks in place.  R: Will continue to monitor on Q15 minute checks.

## 2018-01-06 NOTE — Plan of Care (Signed)
Patient stated he feels as if he is progressing towards his goals.  Problem: Education: Goal: Emotional status will improve Outcome: Progressing Goal: Mental status will improve Outcome: Progressing Goal: Verbalization of understanding the information provided will improve Outcome: Progressing   Problem: Activity: Goal: Interest or engagement in activities will improve Outcome: Progressing

## 2018-01-06 NOTE — Tx Team (Addendum)
Interdisciplinary Treatment and Diagnostic Plan Update  01/07/2018 Time of Session: 0856 Matthew AlmasDonald Kaufman MRN: [id number]  Principal Diagnosis: PTSD (post-traumatic stress disorder)  Secondary Diagnoses: Principal Problem:   PTSD (post-traumatic stress disorder) Active Problems:   Bipolar II disorder, severe, depressed, with anxious distress (HCC)   Polysubstance dependence including opioid type drug, episodic abuse (HCC)   Current Medications:  Current Facility-Administered Medications  Medication Dose Route Frequency Provider Last Rate Last Dose  . acetaminophen (TYLENOL) tablet 650 mg  650 mg Oral Q6H PRN Money, Gerlene Burdockravis B, FNP   650 mg at 01/06/18 1104  . alum & mag hydroxide-simeth (MAALOX/MYLANTA) 200-200-20 MG/5ML suspension 30 mL  30 mL Oral Q4H PRN Money, Feliz Beamravis B, FNP      . amLODipine (NORVASC) tablet 5 mg  5 mg Oral Daily Simon, Spencer E, PA-C      . divalproex (DEPAKOTE) DR tablet 250 mg  250 mg Oral Q12H Micheal Likens[place], Christopher T, MD   250 mg at 01/07/18 0755  . hydrOXYzine (ATARAX/VISTARIL) tablet 50 mg  50 mg Oral Q6H PRN Micheal Likens[place], Christopher T, MD   50 mg at 01/07/18 0813  . ibuprofen (ADVIL,MOTRIN) tablet 800 mg  800 mg Oral Q6H PRN Micheal Likens[place], Christopher T, MD   800 mg at 01/07/18 0757  . Influenza vac split quadrivalent PF (FLUARIX) injection 0.5 mL  0.5 mL Intramuscular Tomorrow-1000 [place], Christopher T, MD      . magnesium hydroxide (MILK OF MAGNESIA) suspension 30 mL  30 mL Oral Daily PRN Money, Gerlene Burdockravis B, FNP      . multivitamin with minerals tablet 1 tablet  1 tablet Oral Daily Money, Gerlene Burdockravis B, FNP   1 tablet at 01/07/18 0755  . thiamine (B-1) injection 100 mg  100 mg Intramuscular Once Money, Feliz Beamravis B, FNP      . thiamine (VITAMIN B-1) tablet 100 mg  100 mg Oral Daily Money, Gerlene Burdockravis B, FNP   100 mg at 01/07/18 0755  . traZODone (DESYREL) tablet 50 mg  50 mg Oral QHS PRN Money, Gerlene Burdockravis B, FNP       PTA Medications: Medications Prior to Admission  Medication  Sig Dispense Refill Last Dose  . albuterol (PROVENTIL HFA;VENTOLIN HFA) 108 (90 Base) MCG/ACT inhaler Inhale 1-2 puffs into the lungs every 6 (six) hours as needed for wheezing or shortness of breath.   Past Month at Unknown time  . ARIPiprazole (ABILIFY) 10 MG tablet Take 1 tablet (10 mg total) daily by mouth. For mood control 30 tablet 0 Past Month at Unknown time  . hydrochlorothiazide (MICROZIDE) 12.5 MG capsule Take 1 capsule (12.5 mg total) by mouth daily. For high blood pressure 30 capsule 0 Past Month at Unknown time  . hydrOXYzine (ATARAX/VISTARIL) 25 MG tablet Take 1 tablet (25 mg by mouth four times daily as needed: For anxiety 90 tablet 0 Past Month at Unknown time  . lisinopril (PRINIVIL,ZESTRIL) 20 MG tablet Take 20 mg by mouth daily.   Past Month at Unknown time  . sertraline (ZOLOFT) 50 MG tablet Take 1 tablet (50 mg total) daily by mouth. For depression 30 tablet 0 Past Month at Unknown time  . ziprasidone (GEODON) 40 MG capsule Take 40 mg by mouth at bedtime.   Past Month at Unknown time  . nicotine (NICODERM CQ - DOSED IN MG/24 HOURS) 21 mg/24hr patch Place 1 patch (21 mg total) daily onto the skin. For smoking cessation 28 patch 0 More than a month at Unknown time    Patient Stressors:  Patient Strengths:    Treatment Modalities: Medication Management, Group therapy, Case management,  1 to 1 session with clinician, Psychoeducation, Recreational therapy.   Physician Treatment Plan for Primary Diagnosis: PTSD (post-traumatic stress disorder) Long Term Goal(s): Improvement in symptoms so as ready for discharge Improvement in symptoms so as ready for discharge   Short Term Goals: Ability to identify and develop effective coping behaviors will improve Ability to demonstrate self-control will improve  Medication Management: Evaluate patient's response, side effects, and tolerance of medication regimen.  Therapeutic Interventions: 1 to 1 sessions, Unit Group sessions and  Medication administration.  Evaluation of Outcomes: Progressing  Physician Treatment Plan for Secondary Diagnosis: Principal Problem:   PTSD (post-traumatic stress disorder) Active Problems:   Bipolar II disorder, severe, depressed, with anxious distress (HCC)   Polysubstance dependence including opioid type drug, episodic abuse (HCC)  Long Term Goal(s): Improvement in symptoms so as ready for discharge Improvement in symptoms so as ready for discharge   Short Term Goals: Ability to identify and develop effective coping behaviors will improve Ability to demonstrate self-control will improve     Medication Management: Evaluate patient's response, side effects, and tolerance of medication regimen.  Therapeutic Interventions: 1 to 1 sessions, Unit Group sessions and Medication administration.  Evaluation of Outcomes: Progressing   RN Treatment Plan for Primary Diagnosis: PTSD (post-traumatic stress disorder) Long Term Goal(s): Knowledge of disease and therapeutic regimen to maintain health will improve  Short Term Goals: Ability to identify and develop effective coping behaviors will improve and Compliance with prescribed medications will improve  Medication Management: RN will administer medications as ordered by provider, will assess and evaluate patient's response and provide education to patient for prescribed medication. RN will report any adverse and/or side effects to prescribing provider.  Therapeutic Interventions: 1 on 1 counseling sessions, Psychoeducation, Medication administration, Evaluate responses to treatment, Monitor vital signs and CBGs as ordered, Perform/monitor CIWA, COWS, AIMS and Fall Risk screenings as ordered, Perform wound care treatments as ordered.  Evaluation of Outcomes: Progressing   LCSW Treatment Plan for Primary Diagnosis: PTSD (post-traumatic stress disorder) Long Term Goal(s): Safe transition to appropriate next level of care at discharge, Engage  patient in therapeutic group addressing interpersonal concerns.  Short Term Goals: Engage patient in aftercare planning with referrals and resources, Increase social support and Increase skills for wellness and recovery  Therapeutic Interventions: Assess for all discharge needs, 1 to 1 time with Social worker, Explore available resources and support systems, Assess for adequacy in community support network, Educate family and significant other(s) on suicide prevention, Complete Psychosocial Assessment, Interpersonal group therapy.  Evaluation of Outcomes: Progressing   Progress in Treatment: Attending groups: No. Participating in groups: No. Taking medication as prescribed: Yes. Toleration medication: Yes. Family/Significant other contact made: No, will contact:  mother Patient understands diagnosis: Yes. Discussing patient identified problems/goals with staff: Yes. Medical problems stabilized or resolved: Yes. Denies suicidal/homicidal ideation: Yes. Issues/concerns per patient self-inventory: No. Other: none  New problem(s) identified: No, Describe:  none  New Short Term/Long Term Goal(s):  Patient Goals:    Discharge Plan or Barriers:   Reason for Continuation of Hospitalization: Depression Medication stabilization  Estimated Length of Stay: 3-5 days.  Attendees: Patient: 01/06/2018   Physician: Dr. Altamese [place], MD 01/06/2018   Nursing: Dewayne Shorter, RN 01/06/2018   RN Care Manager: 01/06/2018   Social Worker: Daleen Squibb, LCSW 01/06/2018   Recreational Therapist:  01/06/2018   Other:  01/06/2018   Other:  01/06/2018   Other:  01/06/2018        Scribe for Treatment Team: Lorri Frederick, LCSW 01/07/2018 1:09 PM

## 2018-01-06 NOTE — Progress Notes (Signed)
Patient self inventory- Patient slept fair last night, sleep meds not requested. Appetite is fair, energy level normal, concentration poor. Depression, hopelessness, and anxiety rated 6, 5, 4. Patient endorses agitation, irritability, and runny nose. Denies SI HI AVH. Endorses pain in his knee, not rated. Patient's goal is "staying focused."  Patient is compliant with medications prescribed per provider. No side effects noted. Safety is maintained with 15 minute checks as well as environmental checks. Will continue to monitor and give support.

## 2018-01-07 NOTE — Progress Notes (Signed)
D:  Matthew Kaufman has been in his bed the entire shift.  He denied SI/HI or A/V hallucinations.  He declined to take his hs medications stating "the vistaril that I took earlier made me too sleepy, I'll take the Depakote tomorrow."  He denied any pain or discomfort and appeared to be in no physical distress.  BP remains elevated and he declined to take any medications just stating "I'll watch my diet."   A:  1:1 with RN for support and encouragement.  Medications as ordered.  Q 15 minute checks maintained for safety.  Encouraged participation in group and unit activities.   R:  Matthew Kaufman remains safe on the unit.  We will continue to monitor the progress towards his goals.
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
## 2018-01-07 NOTE — Plan of Care (Addendum)
Patient's depression, hopelessness, and anxiety rated 7, 4, 4 out of 10. Patient's goal is "not become angry." Denies SI HI AVH.  Problem: Education: Goal: Mental status will improve Outcome: Progressing   Problem: Activity: Goal: Interest or engagement in activities will improve Outcome: Progressing Goal: Sleeping patterns will improve Outcome: Progressing   Problem: Coping: Goal: Ability to verbalize frustrations and anger appropriately will improve Outcome: Progressing Goal: Ability to demonstrate self-control will improve Outcome: Progressing   Problem: Safety: Goal: Periods of time without injury will increase Outcome: Progressing

## 2018-01-08 LAB — VALPROIC ACID LEVEL: Valproic Acid Lvl: 14 ug/mL — ABNORMAL LOW (ref 50.0–100.0)

## 2018-01-08 MED ORDER — DIVALPROEX SODIUM 500 MG PO DR TAB
500.0000 mg | DELAYED_RELEASE_TABLET | Freq: Two times a day (BID) | ORAL | Status: DC
  Administered 2018-01-08 – 2018-01-11 (×6): 500 mg via ORAL
  Filled 2018-01-08 (×10): qty 1

## 2018-01-08 MED ORDER — NICOTINE POLACRILEX 2 MG MT GUM
2.0000 mg | CHEWING_GUM | OROMUCOSAL | Status: DC | PRN
  Administered 2018-01-08 – 2018-01-11 (×22): 2 mg via ORAL
  Filled 2018-01-08 (×5): qty 1

## 2018-01-08 NOTE — Progress Notes (Signed)
Patient ID: Matthew Kaufman, male   DOB: 09/25/1970, 47 y.o.   MRN: [id number]  Pt requests clothes from clothes closet this morning, none available in patients size. Pt looking for belongings sent from someone outpatient this afternoon. While at the nurses station patient begins to say really loudly, "she didn't send me a sweater and sweatshirt? Then who do I need to talk to to get out of this place. She got me all hot." Writer offered to give patient new scrubs. Pt asks for something for knee pain, see MAR. Refuses to go to lunch. Talked to MD. Will continue to monitor.

## 2018-01-08 NOTE — Progress Notes (Signed)
Patient ID: Matthew Kaufman, male   DOB: 02/04/1971, 47 y.o.   MRN: [id number]  D. Pt presents with a blunted affect and restless behavior. Pt seen in the dayroom and around the nurses station often this morning. Pt reports concerns about [facility] assessment. Pt states goal is to "being at peace by staying focused."   A. Labs and vitals monitored. Pt given and educated on medications. Pt supported emotionally and encouraged to express concerns and ask questions. Pt requests nicotine gum for withdrawal.   R. Pt remains safe with 15 minute checks. Pt currently denies SI/HI and AVH and agrees to contact staff before acting on any harmful thoughts. Will continue POC.

## 2018-01-08 NOTE — Progress Notes (Signed)
Pt reports his day has been good.  He denies SI/HI/AVH this evening.  Pt states the CSW is trying to find him a [facility]  He has been up to the med window several times asking for various meds, especially nicotine gum which he has been given three times already this shift.  He has been complaining of L knee pain and received Motrin at the beginning of the shift, then Tylenol a couple hours later.  He then asked for Motrin again before going to bed, but Clinical research associatewriter informed him it was too early.  He was accepting of writer's information and went to bed at that time.  Support and encouragement offered.  Discharge plans are in process.  Safety maintained with q15 minute checks.
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
## 2018-01-08 NOTE — Tx Team (Signed)
Interdisciplinary Treatment and Diagnostic Plan Update  01/08/2018 Time of Session: 0856 Matthew AlmasDonald [place] MRN: [id number]  Principal Diagnosis: PTSD (post-traumatic stress disorder)  Secondary Diagnoses: Principal Problem:   PTSD (post-traumatic stress disorder) Active Problems:   Bipolar II disorder, severe, depressed, with anxious distress (HCC)   Polysubstance dependence including opioid type drug, episodic abuse (HCC)   Current Medications:  Current Facility-Administered Medications  Medication Dose Route Frequency Provider Last Rate Last Dose  . acetaminophen (TYLENOL) tablet 650 mg  650 mg Oral Q6H PRN Money, Gerlene Burdockravis B, FNP   650 mg at 01/06/18 1104  . alum & mag hydroxide-simeth (MAALOX/MYLANTA) 200-200-20 MG/5ML suspension 30 mL  30 mL Oral Q4H PRN Money, Feliz Beamravis B, FNP      . amLODipine (NORVASC) tablet 5 mg  5 mg Oral Daily Simon, Spencer E, PA-C      . divalproex (DEPAKOTE) DR tablet 500 mg  500 mg Oral Q12H [place], Christopher T, MD      . hydrOXYzine (ATARAX/VISTARIL) tablet 50 mg  50 mg Oral Q6H PRN Micheal Likensainville, Christopher T, MD   50 mg at 01/07/18 0813  . ibuprofen (ADVIL,MOTRIN) tablet 800 mg  800 mg Oral Q6H PRN Micheal Likensainville, Christopher T, MD   800 mg at 01/08/18 1210  . Influenza vac split quadrivalent PF (FLUARIX) injection 0.5 mL  0.5 mL Intramuscular Tomorrow-1000 [place], Christopher T, MD      . magnesium hydroxide (MILK OF MAGNESIA) suspension 30 mL  30 mL Oral Daily PRN Money, Feliz Beamravis B, FNP      . multivitamin with minerals tablet 1 tablet  1 tablet Oral Daily Money, Gerlene Burdockravis B, FNP   1 tablet at 01/08/18 [id number]  . nicotine polacrilex (NICORETTE) gum 2 mg  2 mg Oral PRN Micheal Likensainville, Christopher T, MD   2 mg at 01/08/18 1308  . thiamine (B-1) injection 100 mg  100 mg Intramuscular Once Money, Feliz Beamravis B, FNP      . thiamine (VITAMIN B-1) tablet 100 mg  100 mg Oral Daily Money, Gerlene Burdockravis B, FNP   100 mg at 01/08/18 [id number]  . traZODone (DESYREL) tablet 50 mg  50 mg Oral QHS PRN  Money, Gerlene Burdockravis B, FNP       PTA Medications: Medications Prior to Admission  Medication Sig Dispense Refill Last Dose  . albuterol (PROVENTIL HFA;VENTOLIN HFA) 108 (90 Base) MCG/ACT inhaler Inhale 1-2 puffs into the lungs every 6 (six) hours as needed for wheezing or shortness of breath.   Past Month at Unknown time  . ARIPiprazole (ABILIFY) 10 MG tablet Take 1 tablet (10 mg total) daily by mouth. For mood control 30 tablet 0 Past Month at Unknown time  . hydrochlorothiazide (MICROZIDE) 12.5 MG capsule Take 1 capsule (12.5 mg total) by mouth daily. For high blood pressure 30 capsule 0 Past Month at Unknown time  . hydrOXYzine (ATARAX/VISTARIL) 25 MG tablet Take 1 tablet (25 mg by mouth four times daily as needed: For anxiety 90 tablet 0 Past Month at Unknown time  . lisinopril (PRINIVIL,ZESTRIL) 20 MG tablet Take 20 mg by mouth daily.   Past Month at Unknown time  . sertraline (ZOLOFT) 50 MG tablet Take 1 tablet (50 mg total) daily by mouth. For depression 30 tablet 0 Past Month at Unknown time  . ziprasidone (GEODON) 40 MG capsule Take 40 mg by mouth at bedtime.   Past Month at Unknown time  . nicotine (NICODERM CQ - DOSED IN MG/24 HOURS) 21 mg/24hr patch Place 1 patch (21 mg total) daily onto  the skin. For smoking cessation 28 patch 0 More than a month at Unknown time    Patient Stressors:    Patient Strengths:    Treatment Modalities: Medication Management, Group therapy, Case management,  1 to 1 session with clinician, Psychoeducation, Recreational therapy.   Physician Treatment Plan for Primary Diagnosis: PTSD (post-traumatic stress disorder) Long Term Goal(s): Improvement in symptoms so as ready for discharge Improvement in symptoms so as ready for discharge   Short Term Goals: Ability to identify and develop effective coping behaviors will improve Ability to demonstrate self-control will improve  Medication Management: Evaluate patient's response, side effects, and tolerance of  medication regimen.  Therapeutic Interventions: 1 to 1 sessions, Unit Group sessions and Medication administration.  Evaluation of Outcomes: Progressing  Physician Treatment Plan for Secondary Diagnosis: Principal Problem:   PTSD (post-traumatic stress disorder) Active Problems:   Bipolar II disorder, severe, depressed, with anxious distress (HCC)   Polysubstance dependence including opioid type drug, episodic abuse (HCC)  Long Term Goal(s): Improvement in symptoms so as ready for discharge Improvement in symptoms so as ready for discharge   Short Term Goals: Ability to identify and develop effective coping behaviors will improve Ability to demonstrate self-control will improve     Medication Management: Evaluate patient's response, side effects, and tolerance of medication regimen.  Therapeutic Interventions: 1 to 1 sessions, Unit Group sessions and Medication administration.  Evaluation of Outcomes: Progressing   RN Treatment Plan for Primary Diagnosis: PTSD (post-traumatic stress disorder) Long Term Goal(s): Knowledge of disease and therapeutic regimen to maintain health will improve  Short Term Goals: Ability to identify and develop effective coping behaviors will improve and Compliance with prescribed medications will improve  Medication Management: RN will administer medications as ordered by provider, will assess and evaluate patient's response and provide education to patient for prescribed medication. RN will report any adverse and/or side effects to prescribing provider.  Therapeutic Interventions: 1 on 1 counseling sessions, Psychoeducation, Medication administration, Evaluate responses to treatment, Monitor vital signs and CBGs as ordered, Perform/monitor CIWA, COWS, AIMS and Fall Risk screenings as ordered, Perform wound care treatments as ordered.  Evaluation of Outcomes: Progressing   LCSW Treatment Plan for Primary Diagnosis: PTSD (post-traumatic stress  disorder) Long Term Goal(s): Safe transition to appropriate next level of care at discharge, Engage patient in therapeutic group addressing interpersonal concerns.  Short Term Goals: Engage patient in aftercare planning with referrals and resources, Increase social support and Increase skills for wellness and recovery  Therapeutic Interventions: Assess for all discharge needs, 1 to 1 time with Social worker, Explore available resources and support systems, Assess for adequacy in community support network, Educate family and significant other(s) on suicide prevention, Complete Psychosocial Assessment, Interpersonal group therapy.  Evaluation of Outcomes: Progressing   Progress in Treatment: Attending groups: No. Participating in groups: No. Taking medication as prescribed: Yes. Toleration medication: Yes. Family/Significant other contact made: No, will contact:  mother Patient understands diagnosis: Yes. Discussing patient identified problems/goals with staff: Yes. Medical problems stabilized or resolved: Yes. Denies suicidal/homicidal ideation: Yes. Issues/concerns per patient self-inventory: No. Other: none  New problem(s) identified: No, Describe:  none  New Short Term/Long Term Goal(s):Detox, medication stabilization, elimination of SI thoughts, development of comprehensive mental wellness plan.    Patient Goals:    Discharge Plan or Barriers: Patient expressed interest in residential treatment for substance abuse treatment. [facility] for [facility]. Patient also plans to follow up with [facility] for  outpatient medication management and therapy services following completion of substance abuse treatment.    Reason for Continuation of Hospitalization: Depression Medication stabilization  Estimated Length of Stay: 3-5 days  Attendees: Patient: 01/06/2018   Physician: Dr. Altamese , MD 01/06/2018   Nursing: Dewayne Shorter,  RN; Erskine Squibb.Val Eagle, RN  01/06/2018   RN Care Manager: Onnie Boer, RN  01/06/2018   Social Worker: Daleen Squibb, LCSW; Baldo Daub, LCSWA 01/06/2018   Recreational Therapist:  01/06/2018   Other:  01/06/2018   Other:  01/06/2018   Other: 01/06/2018        Scribe for Treatment Team: Maeola Sarah, LCSWA 01/08/2018 1:58 PM

## 2018-01-08 NOTE — Progress Notes (Signed)
Pt attended AA group this evening.  

## 2018-01-08 NOTE — Progress Notes (Signed)
Recreation Therapy Notes  Date: 01/08/18 Time: 0930 Location: 300 Hall Dayroom  Group Topic: Stress Management  Goal Area(s) Addresses:  Patient will verbalize importance of using healthy stress management.  Patient will identify positive emotions associated with healthy stress management.   Intervention: Stress Management  Activity :  Meditation.  LRT introduced the stress management technique of meditation to the patients.  LRT played a meditation that focused on being resilient in the face of challenge.  Patients were to listen and follow along as the meditation was played to engage in activity.  Education:  Stress Management, Discharge Planning.   Education Outcome: Acknowledges edcuation/In group clarification offered/Needs additional education  Clinical Observations/Feedback: Pt did not attend group.     Caroll RancherMarjette Ezinne Yogi, LRT/CTRS         Caroll RancherLindsay, Arlie Posch A 01/08/2018 12:45 PM

## 2018-01-09 DIAGNOSIS — F3131 Bipolar disorder, current episode depressed, mild: Secondary | ICD-10-CM

## 2018-01-09 DIAGNOSIS — F10239 Alcohol dependence with withdrawal, unspecified: Secondary | ICD-10-CM

## 2018-01-09 DIAGNOSIS — G4700 Insomnia, unspecified: Secondary | ICD-10-CM

## 2018-01-09 NOTE — Progress Notes (Signed)
Pt presents with an animated affect and anxious mood. Pt rated on his self inventory sheet depression 4/10, anxiety 4/10 and hopelessness 3/10. Pt denies SI/HI. Pt reported fair sleep last night. Pt reported having a fair appetite today. Pt c/o withdrawal symptoms of agitation and irritability on his self inventory sheet. During shift assessment, pt denied having any withdrawal symptoms. No agitation observed. Pt stated goal "staying positive". Pt compliant with taking meds and no side effects verbalized by pt. Writer educated pt on his elevated blood pressure this morning. With encouragement from writer, pt agreed to taking antihypertensive medication. Pt expressed that his b/p is always elevated and that he doesn't need to take b/p medications for it. Pt stated that his b/p will eventually go down. Writer reassessed pt's b/p. Pt's b/p elevated and writer notified Alcario Droughtanika, NP.   Medications administered as ordered per MD. Verbal support provided. Pt encouraged to attend groups. 15 minute checks performed for safety.  Pt compliant with tx plan.

## 2018-01-09 NOTE — B[facility] Counselor (Signed)
Clinical Social Work Note  CSW made contact with [facility] admission nurse, who stated that patient cannot be approved by her alone due to some concerns she has, and therefore the head nursing administrator has to look at the referral and make the final determination.  This will occur either today or tomorrow, hopefully, and [facility] will call CSW back.  Patient informed.  Ambrose MantleMareida Grossman-Orr, LCSW 01/09/2018, 9:05 AM

## 2018-01-09 NOTE — Progress Notes (Signed)
[facility] Group Notes:  (Nursing/MHT/Case Management/Adjunct)  Date:  01/09/2018  Time: 1300 Type of Therapy:  Nurse Education-Patricia Duke, RN.  Participation Level:  did not attend 

## 2018-01-09 NOTE — Progress Notes (Signed)
[facility] MD Progress Note  01/09/2018 9:57 AM Matthew Kaufman  MRN:  [id number]   Evaluation: Matthew Kaufman seen sitting in the hallway using the telephone.  Patient reports " I am fine" currently denying homicidal or suicidal ideations.  Patient reports he was intoxicated which is why he made those threats and comments towards his girlfriend.  Denies depression or depressive symptoms.  Was reported by nursing staff patient has been refusing hypertension medication during his stay.  However with much encouragement patient has taking his first dose today.  Patient denies headaches, chest pain or dizziness.  Education was provided with hypertension management and cardiovascular event risk.  Denies depression or depressive symptoms.  Reports taking most medications as prescribed and tolerating them well.  Patient reports he is hopeful to follow-up with [facility] residential treatment after discharge. Patient reports attending group sessions.  Support encouragement reassurance was provided  History: Matthew Kaufman is a 47 y/o M with history of MDD, bipolar, PTSD, and polysubstance abuse who was admitted as a voluntary walk-in to [facility] with complaint of worsening depression, anxiety, HI towards his girlfriend, impulsivity, and worsening polysubstance abuse of alcohol, cocaine, heroin, and cannabis. Pt was medically cleared and then admitted to the 300 unit for additional treatment and evaluation.  Principal Problem: PTSD (post-traumatic stress disorder) Diagnosis: Principal Problem:   PTSD (post-traumatic stress disorder) Active Problems:   Bipolar II disorder, severe, depressed, with anxious distress (HCC)   Polysubstance dependence including opioid type drug, episodic abuse (HCC)  Total Time spent with patient: 15 minutes  Past Psychiatric History:   Past Medical History:  Past Medical History:  Diagnosis Date  . Hypertension     Past Surgical History:  Procedure Laterality Date  . APPENDECTOMY     Family  History: History reviewed. No pertinent family history. Family Psychiatric  History:  Social History:  Social History   Substance and Sexual Activity  Alcohol Use Yes  . Alcohol/week: 6.0 standard drinks  . Types: 6 Glasses of wine per week     Social History   Substance and Sexual Activity  Drug Use Yes  . Types: Marijuana, Cocaine    Social History   Socioeconomic History  . Marital status: Married    Spouse name: Not on file  . Number of children: Not on file  . Years of education: Not on file  . Highest education level: Not on file  Occupational History  . Not on file  Social Needs  . Financial resource strain: Hard  . Food insecurity:    Worry: Not on file    Inability: Not on file  . Transportation needs:    Medical: Not on file    Non-medical: Not on file  Tobacco Use  . Smoking status: Current Every Day Smoker    Packs/day: 0.50    Types: Cigarettes  . Smokeless tobacco: Never Used  Substance and Sexual Activity  . Alcohol use: Yes    Alcohol/week: 6.0 standard drinks    Types: 6 Glasses of wine per week  . Drug use: Yes    Types: Marijuana, Cocaine  . Sexual activity: Yes  Lifestyle  . Physical activity:    Days per week: Not on file    Minutes per session: Not on file  . Stress: Not on file  Relationships  . Social connections:    Talks on phone: Not on file    Gets together: Not on file    Attends religious service: Not on file    Active  member of club or organization: Not on file    Attends meetings of clubs or organizations: Not on file    Relationship status: Not on file  Other Topics Concern  . Not on file  Social History Narrative  . Not on file   Additional Social History:    Pain Medications: see MAR Prescriptions: see MAR Over the Counter: see MAR History of alcohol / drug use?: Yes Longest period of sobriety (when/how long): Patient has no history of complete sobriety, states that he was clean off cocaine for several  years Negative Consequences of Use: Financial, Legal, Personal relationships, Work / School Name of Substance 1: cocaine 1 - Age of First Use: 11 1 - Amount (size/oz): binges, amt undetermined 1 - Frequency: daily for past three days 1 - Duration: since onset with some years clean recently 1 - Last Use / Amount: last pm, undetermined amount Name of Substance 2: alcohol 2 - Age of First Use: 6 2 - Amount (size/oz): 18 beers 2 - Frequency: daily 2 - Duration: since onset 2 - Last Use / Amount: last pm Name of Substance 3: Marijuana 3 - Age of First Use: 6 3 - Amount (size/oz): 3 grams 3 - Frequency: daily 3 - Duration: since onset 3 - Last Use / Amount: yesterday              Sleep: Fair  Appetite:  Fair  Current Medications: Current Facility-Administered Medications  Medication Dose Route Frequency Provider Last Rate Last Dose  . acetaminophen (TYLENOL) tablet 650 mg  650 mg Oral Q6H PRN Money, Gerlene Burdock, FNP   650 mg at 01/08/18 2129  . alum & mag hydroxide-simeth (MAALOX/MYLANTA) 200-200-20 MG/5ML suspension 30 mL  30 mL Oral Q4H PRN Money, Feliz Beam B, FNP      . amLODipine (NORVASC) tablet 5 mg  5 mg Oral Daily Donell Sievert E, PA-C   5 mg at 01/09/18 1610  . divalproex (DEPAKOTE) DR tablet 500 mg  500 mg Oral Q12H Micheal Likens, MD   500 mg at 01/09/18 9604  . hydrOXYzine (ATARAX/VISTARIL) tablet 50 mg  50 mg Oral Q6H PRN Micheal Likens, MD   50 mg at 01/07/18 0813  . ibuprofen (ADVIL,MOTRIN) tablet 800 mg  800 mg Oral Q6H PRN Micheal Likens, MD   800 mg at 01/09/18 0422  . Influenza vac split quadrivalent PF (FLUARIX) injection 0.5 mL  0.5 mL Intramuscular Tomorrow-1000 [place], Christopher T, MD      . magnesium hydroxide (MILK OF MAGNESIA) suspension 30 mL  30 mL Oral Daily PRN Money, Gerlene Burdock, FNP      . multivitamin with minerals tablet 1 tablet  1 tablet Oral Daily Money, Gerlene Burdock, FNP   1 tablet at 01/09/18 0811  . nicotine  polacrilex (NICORETTE) gum 2 mg  2 mg Oral PRN Micheal Likens, MD   2 mg at 01/09/18 0631  . thiamine (B-1) injection 100 mg  100 mg Intramuscular Once Money, Feliz Beam B, FNP      . thiamine (VITAMIN B-1) tablet 100 mg  100 mg Oral Daily Money, Gerlene Burdock, FNP   100 mg at 01/09/18 5409  . traZODone (DESYREL) tablet 50 mg  50 mg Oral QHS PRN Money, Gerlene Burdock, FNP        Lab Results:  Results for orders placed or performed during the hospital encounter of 01/04/18 (from the past 48 hour(s))  Valproic acid level     Status: Abnormal  Collection Time: 01/08/18  6:32 AM  Result Value Ref Range   Valproic Acid Lvl 14 (L) 50.0 - 100.0 ug/mL    Comment: Performed at [facility], [address] [place], [place] [address]    Blood Alcohol level:  Lab Results  Component Value Date   ETH <10 01/05/2018   ETH 201 (H) 04/25/2017    Metabolic Disorder Labs: Lab Results  Component Value Date   HGBA1C 6.1 (H) 01/05/2018   MPG 128.37 01/05/2018   MPG 134.11 12/23/2016   Lab Results  Component Value Date   PROLACTIN 15.9 (H) 12/23/2016   Lab Results  Component Value Date   CHOL 134 01/05/2018   TRIG 57 01/05/2018   HDL 55 01/05/2018   CHOLHDL 2.4 01/05/2018   VLDL 11 01/05/2018   LDLCALC 68 01/05/2018   LDLCALC 85 12/23/2016    Physical Findings: AIMS: Facial and Oral Movements Muscles of Facial Expression: None, normal Lips and Perioral Area: None, normal Jaw: None, normal Tongue: None, normal,Extremity Movements Upper (arms, wrists, hands, fingers): None, normal Lower (legs, knees, ankles, toes): None, normal, Trunk Movements Neck, shoulders, hips: None, normal, Overall Severity Severity of abnormal movements (highest score from questions above): None, normal Incapacitation due to abnormal movements: None, normal Patient's awareness of abnormal movements (rate only patient's report): No Awareness, Dental Status Current problems with teeth and/or  dentures?: No Does patient usually wear dentures?: No  CIWA:  CIWA-Ar Total: 0 COWS:  COWS Total Score: 1  Musculoskeletal: Strength & Muscle Tone: within normal limits Gait & Station: normal Patient leans: N/A  Psychiatric Specialty Exam: Physical Exam  Vitals reviewed. Constitutional: He appears well-developed.  Psychiatric: He has a normal mood and affect. His behavior is normal.    Review of Systems  Psychiatric/Behavioral: Positive for depression and substance abuse. The patient is nervous/anxious.   All other systems reviewed and are negative.   Blood pressure (!) 162/112, pulse 60, temperature 98 F (36.7 C), resp. rate 18, height 5\' 9"  (1.753 m), weight 95.7 kg, SpO2 98 %.Body mass index is 31.16 kg/m.  General Appearance: Casual  Eye Contact:  Fair  Speech:  Clear and Coherent  Volume:  Normal  Mood:  Anxious and Depressed  Affect:  Congruent  Thought Process:  Coherent  Orientation:  Full (Time, Place, and Person)  Thought Content:  Hallucinations: None  Suicidal Thoughts:  No  Homicidal Thoughts:  No  Memory:  Immediate;   Fair Recent;   Fair Remote;   Fair  Judgement:  Fair  Insight:  Fair  Psychomotor Activity:  Normal  Concentration:  Concentration: Fair  Recall:  [employer]Fair  Fund of Knowledge:  Fair  Language:  Fair  Akathisia:  No  Handed:  Right  AIMS (if indicated):     Assets:  Communication Skills Desire for Improvement Social Support  ADL's:  Intact  Cognition:  WNL  Sleep:  Number of Hours: 5     Treatment Plan Summary: Daily contact with patient to assess and evaluate symptoms and progress in treatment and Medication management   Continue with current treatment plan on 01/09/2018 as listed below except were noted  Mood stabilization:  Continue Depakote 500 mg p.o. 4 times daily twice daily valproic level 14  Insomnia:  Continue trazodone 50 mg p.o. Nightly   CSW to continue working on discharge disposition Patient encouraged to  participate in therapeutic milieu      Oneta Rackanika N Lewis, NP 01/09/2018, 9:57 AM

## 2018-01-09 NOTE — Progress Notes (Signed)
Psychoeducational Group Note  Date:  01/09/2018 Time:  2327  Group Topic/Focus:  Wrap-Up Group:   The focus of this group is to help patients review their daily goal of treatment and discuss progress on daily workbooks.  Participation Level: Did Not Attend  Participation Quality:  Not Applicable  Affect:  Not Applicable  Cognitive:  Not Applicable  Insight:  Not Applicable  Engagement in Group: Not Applicable  Additional Comments:  The patient did not attend group and elected to remain in his bedroom.   Hazle CocaGOODMAN, Dezyrae Kensinger S 01/09/2018, 11:27 PM

## 2018-01-09 NOTE — Plan of Care (Signed)
  Problem: Safety: Goal: Periods of time without injury will increase Outcome: Progressing   

## 2018-01-09 NOTE — [facility] Group Notes (Signed)
[facility] LCSW Group Therapy Note  Date/Time: 01/09/18, 1000  Type of Therapy/Topic:  Group Therapy:  Balance in Life  Participation Level:  Did not attend  Description of Group:    This group will address the concept of balance and how it feels and looks when one is unbalanced. Patients will be encouraged to process areas in their lives that are out of balance, and identify reasons for remaining unbalanced. Facilitators will guide patients utilizing problem- solving interventions to address and correct the stressor making their life unbalanced. Understanding and applying boundaries will be explored and addressed for obtaining  and maintaining a balanced life. Patients will be encouraged to explore ways to assertively make their unbalanced needs known to significant others in their lives, using other group members and facilitator for support and feedback.  Therapeutic Goals: 1. Patient will identify two or more emotions or situations they have that consume much of in their lives. 2. Patient will identify signs/triggers that life has become out of balance:  3. Patient will identify two ways to set boundaries in order to achieve balance in their lives:  4. Patient will demonstrate ability to communicate their needs through discussion and/or role plays  Summary of Patient Progress:          Therapeutic Modalities:   Cognitive Behavioral Therapy Solution-Focused Therapy Assertiveness Training  Greg Likisha Alles, LCSW 

## 2018-01-10 NOTE — Progress Notes (Signed)
DAR NOTE: Patient presents with calm affect and bright mood. Pt has been observed in the dayroom interacting with peers and staff. Pt stated he a good day, pt also stated he is greatful he was not discharged yesterday as he demanded because he feel better toady. Pt attended the AA group, pt stated felt like he benefited from it. Denies pain, auditory and visual hallucinations.  Maintained on routine safety checks.  Medications given as prescribed.  Support and encouragement offered as needed.  Will continue to monitor.

## 2018-01-10 NOTE — Progress Notes (Addendum)
Patient did not attend AA meeting. Patient notified of meeting and patient went to his room.

## 2018-01-10 NOTE — Progress Notes (Signed)
[facility] MD Progress Note  01/10/2018 9:31 AM Matthew Kaufman  MRN:  [id number]   Evaluation: Matthew Kaufman seen standing at the nurses station interacting with peers.  Denies homicidal or suicidal ideations during this assessment.  Denies depression or depressive symptoms.  Reports feeling better overall.  States he is hopeful to get him to [place] to start his treatment for alcohol abuse.  Patient appears to minimize symptoms.  Reports a good appetite.  States he is resting well.  Patient reports attending daily group sessions as he reports AA meetings have been the most helpful since his admission.  Patient did denies medication side effects.  Reports taking and tolerating medications well.  Patient reports he is hopeful to stay here as long as he can, states his plan is  to wait for placement.  Support encouragement and reassurance was provided.  History: Matthew Kaufman is a 47 y/o M with history of MDD, bipolar, PTSD, and polysubstance abuse who was admitted as a voluntary walk-in to [facility] with complaint of worsening depression, anxiety, HI towards his girlfriend, impulsivity, and worsening polysubstance abuse of alcohol, cocaine, heroin, and cannabis. Pt was medically cleared and then admitted to the 300 unit for additional treatment and evaluation.  Principal Problem: PTSD (post-traumatic stress disorder) Diagnosis: Principal Problem:   PTSD (post-traumatic stress disorder) Active Problems:   Bipolar II disorder, severe, depressed, with anxious distress (HCC)   Polysubstance dependence including opioid type drug, episodic abuse (HCC)  Total Time spent with patient: 15 minutes  Past Psychiatric History:   Past Medical History:  Past Medical History:  Diagnosis Date  . Hypertension     Past Surgical History:  Procedure Laterality Date  . APPENDECTOMY     Family History: History reviewed. No pertinent family history. Family Psychiatric  History:  Social History:  Social History   Substance and  Sexual Activity  Alcohol Use Yes  . Alcohol/week: 6.0 standard drinks  . Types: 6 Glasses of wine per week     Social History   Substance and Sexual Activity  Drug Use Yes  . Types: Marijuana, Cocaine    Social History   Socioeconomic History  . Marital status: Married    Spouse name: Not on file  . Number of children: Not on file  . Years of education: Not on file  . Highest education level: Not on file  Occupational History  . Not on file  Social Needs  . Financial resource strain: Hard  . Food insecurity:    Worry: Not on file    Inability: Not on file  . Transportation needs:    Medical: Not on file    Non-medical: Not on file  Tobacco Use  . Smoking status: Current Every Day Smoker    Packs/day: 0.50    Types: Cigarettes  . Smokeless tobacco: Never Used  Substance and Sexual Activity  . Alcohol use: Yes    Alcohol/week: 6.0 standard drinks    Types: 6 Glasses of wine per week  . Drug use: Yes    Types: Marijuana, Cocaine  . Sexual activity: Yes  Lifestyle  . Physical activity:    Days per week: Not on file    Minutes per session: Not on file  . Stress: Not on file  Relationships  . Social connections:    Talks on phone: Not on file    Gets together: Not on file    Attends religious service: Not on file    Active member of club or organization: Not  on file    Attends meetings of clubs or organizations: Not on file    Relationship status: Not on file  Other Topics Concern  . Not on file  Social History Narrative  . Not on file   Additional Social History:    Pain Medications: see MAR Prescriptions: see MAR Over the Counter: see MAR History of alcohol / drug use?: Yes Longest period of sobriety (when/how long): Patient has no history of complete sobriety, states that he was clean off cocaine for several years Negative Consequences of Use: Financial, Legal, Personal relationships, Work / School Name of Substance 1: cocaine 1 - Age of First Use:  11 1 - Amount (size/oz): binges, amt undetermined 1 - Frequency: daily for past three days 1 - Duration: since onset with some years clean recently 1 - Last Use / Amount: last pm, undetermined amount Name of Substance 2: alcohol 2 - Age of First Use: 6 2 - Amount (size/oz): 18 beers 2 - Frequency: daily 2 - Duration: since onset 2 - Last Use / Amount: last pm Name of Substance 3: Marijuana 3 - Age of First Use: 6 3 - Amount (size/oz): 3 grams 3 - Frequency: daily 3 - Duration: since onset 3 - Last Use / Amount: yesterday              Sleep: Fair  Appetite:  Fair  Current Medications: Current Facility-Administered Medications  Medication Dose Route Frequency Provider Last Rate Last Dose  . acetaminophen (TYLENOL) tablet 650 mg  650 mg Oral Q6H PRN Money, Gerlene Burdockravis B, FNP   650 mg at 01/10/18 0757  . alum & mag hydroxide-simeth (MAALOX/MYLANTA) 200-200-20 MG/5ML suspension 30 mL  30 mL Oral Q4H PRN Money, Feliz Beamravis B, FNP      . amLODipine (NORVASC) tablet 5 mg  5 mg Oral Daily Donell SievertSimon, Spencer E, PA-C   5 mg at 01/10/18 0755  . divalproex (DEPAKOTE) DR tablet 500 mg  500 mg Oral Q12H Micheal Likensainville, Christopher T, MD   500 mg at 01/10/18 0755  . hydrOXYzine (ATARAX/VISTARIL) tablet 50 mg  50 mg Oral Q6H PRN Micheal Likensainville, Christopher T, MD   50 mg at 01/07/18 0813  . ibuprofen (ADVIL,MOTRIN) tablet 800 mg  800 mg Oral Q6H PRN Micheal Likensainville, Christopher T, MD   800 mg at 01/10/18 0351  . Influenza vac split quadrivalent PF (FLUARIX) injection 0.5 mL  0.5 mL Intramuscular Tomorrow-1000 [place], Christopher T, MD      . magnesium hydroxide (MILK OF MAGNESIA) suspension 30 mL  30 mL Oral Daily PRN Money, Gerlene Burdockravis B, FNP      . multivitamin with minerals tablet 1 tablet  1 tablet Oral Daily Money, Gerlene Burdockravis B, FNP   1 tablet at 01/10/18 0756  . nicotine polacrilex (NICORETTE) gum 2 mg  2 mg Oral PRN Micheal Likens[place]ville, Christopher T, MD   2 mg at 01/10/18 0800  . thiamine (B-1) injection 100 mg  100 mg  Intramuscular Once Money, Feliz Beamravis B, FNP      . thiamine (VITAMIN B-1) tablet 100 mg  100 mg Oral Daily Money, Gerlene Burdockravis B, FNP   100 mg at 01/10/18 0756  . traZODone (DESYREL) tablet 50 mg  50 mg Oral QHS PRN Money, Gerlene Burdockravis B, FNP        Lab Results:  No results found for this or any previous visit (from the past 48 hour(s)).  Blood Alcohol level:  Lab Results  Component Value Date   ETH <10 01/05/2018   ETH 201 (  H) 04/25/2017    Metabolic Disorder Labs: Lab Results  Component Value Date   HGBA1C 6.1 (H) 01/05/2018   MPG 128.37 01/05/2018   MPG 134.11 12/23/2016   Lab Results  Component Value Date   PROLACTIN 15.9 (H) 12/23/2016   Lab Results  Component Value Date   CHOL 134 01/05/2018   TRIG 57 01/05/2018   HDL 55 01/05/2018   CHOLHDL 2.4 01/05/2018   VLDL 11 01/05/2018   LDLCALC 68 01/05/2018   LDLCALC 85 12/23/2016    Physical Findings: AIMS: Facial and Oral Movements Muscles of Facial Expression: None, normal Lips and Perioral Area: None, normal Jaw: None, normal Tongue: None, normal,Extremity Movements Upper (arms, wrists, hands, fingers): None, normal Lower (legs, knees, ankles, toes): None, normal, Trunk Movements Neck, shoulders, hips: None, normal, Overall Severity Severity of abnormal movements (highest score from questions above): None, normal Incapacitation due to abnormal movements: None, normal Patient's awareness of abnormal movements (rate only patient's report): No Awareness, Dental Status Current problems with teeth and/or dentures?: No Does patient usually wear dentures?: No  CIWA:  CIWA-Ar Total: 0 COWS:  COWS Total Score: 1  Musculoskeletal: Strength & Muscle Tone: within normal limits Gait & Station: normal Patient leans: N/A  Psychiatric Specialty Exam: Physical Exam  Vitals reviewed. Constitutional: He appears well-developed.  Psychiatric: He has a normal mood and affect. His behavior is normal.    Review of Systems   Psychiatric/Behavioral: Positive for depression and substance abuse. The patient is nervous/anxious.   All other systems reviewed and are negative.   Blood pressure (!) 151/114, pulse 66, temperature 98 F (36.7 C), resp. rate 18, height 5\' 9"  (1.753 m), weight 95.7 kg, SpO2 98 %.Body mass index is 31.16 kg/m.  General Appearance: Casual  Eye Contact:  Fair  Speech:  Clear and Coherent  Volume:  Normal  Mood:  Anxious and Depressed  Affect:  Congruent  Thought Process:  Coherent  Orientation:  Full (Time, Place, and Person)  Thought Content:  Hallucinations: None  Suicidal Thoughts:  No  Homicidal Thoughts:  No  Memory:  Immediate;   Fair Recent;   Fair Remote;   Fair  Judgement:  Fair  Insight:  Fair  Psychomotor Activity:  Normal  Concentration:  Concentration: Fair  Recall:  [employer] of Knowledge:  Fair  Language:  Fair  Akathisia:  No  Handed:  Right  AIMS (if indicated):     Assets:  Communication Skills Desire for Improvement Social Support  ADL's:  Intact  Cognition:  WNL  Sleep:  Number of Hours: 4.75     Treatment Plan Summary: Daily contact with patient to assess and evaluate symptoms and progress in treatment and Medication management   Continue with current treatment plan on 01/10/2018 as listed below except were noted  Mood stabilization:  Continue Depakote 500 mg p.o. 4 times daily twice daily valproic level 14  Insomnia:  Continue trazodone 50 mg p.o. Nightly   CSW to continue working on discharge disposition- [facility]? Patient encouraged to participate in therapeutic milieu   Oneta Rack, NP 01/10/2018, 9:31 AM

## 2018-01-10 NOTE — Plan of Care (Signed)
  Problem: Education: Goal: Knowledge of [facility] Education information/materials will improve Outcome: Progressing   Problem: Activity: Goal: Interest or engagement in activities will improve Outcome: Progressing   Problem: Coping: Goal: Ability to verbalize frustrations and anger appropriately will improve Outcome: Progressing   Problem: Safety: Goal: Periods of time without injury will increase Outcome: Progressing   

## 2018-01-10 NOTE — [facility] Group Notes (Signed)
Pt was invited but did not attend orientation group facilitated by MHT Michael A. 

## 2018-01-10 NOTE — Progress Notes (Signed)
D:  Patient's self inventory sheet, patient has fair sleep, no sleep medication.  Fair appetite, normal energy level, good concentration.  Rated depression 5, hopeless and anxiety 3.  Withdrawals, agitation, irritability.  No SI.  Physical problems, knee.  Physical pain, worst pain #6 in past 24 hours.  Pain medicatton helpful.  Goal is stay positive.  Plans to take medications and pray.  Does have discharge plans. A:  Medications administered per MD orders.  Emotional support and encouragement given patient. R:  Denied SI and HI, contracts for safety.  Denied A/V hallucinations.  Safety maintained with 15 minute checks.

## 2018-01-10 NOTE — B[facility] Group Notes (Signed)
LCSW Group Therapy Note  01/10/2018    10:00-11:00am   Type of Therapy and Topic:  Group Therapy: Early Messages Received About Anger  Participation Level:  Did Not Attend   Description of Group:   In this group, patients shared and discussed the early messages received in their lives about anger through parental or other adult modeling, teaching, repression, punishment, violence, and more.  Participants identified how those childhood lessons influence even now how they usually or often react when angered.  The group discussed that anger is a secondary emotion and what may be the underlying emotional themes that come out through anger outbursts or that are ignored through anger suppression.  Finally, as a group there was a conversation about the workbook's quote that "There is nothing wrong with anger; it is just a sign something needs to change."     Therapeutic Goals: 1. Patients will identify one or more childhood message about anger that they received and how it was taught to them. 2. Patients will discuss how these childhood experiences have influenced and continue to influence their own expression or repression of anger even today. 3. Patients will explore possible primary emotions that tend to fuel their secondary emotion of anger. 4. Patients will learn that anger itself is normal and cannot be eliminated, and that healthier coping skills can assist with resolving conflict rather than worsening situations.  Summary of Patient Progress:  N/A  Therapeutic Modalities:   Cognitive Behavioral Therapy Motivation Interviewing  Matthew Kaufman  .  

## 2018-01-10 NOTE — Progress Notes (Signed)
D   Pt is irritable and has limited interaction with others  He is loud and abrasive at times   Pt requested medication for sleep and went to bed A    Verbal support given   Medications administered and effectiveness monitored     Q 15 min checks R    Pt is safe at this time

## 2018-01-10 NOTE — B[facility] Group Notes (Signed)
[facility] Group Notes:  (Nursing/MHT/Case Management/Adjunct)  Date:  01/10/2018  Time:  1:31 PM  Type of Therapy:  Psychoeducational Skills  Participation Level:  Active  Participation Quality:  Appropriate  Affect:  Appropriate  Cognitive:  Appropriate  Insight:  Appropriate  Engagement in Group:  Engaged  Modes of Intervention:  Problem-solving  Summary of Progress/Problems: Pt attended Psychoeducational group with top topic healthy support systems.   Jacquelyne BalintForrest, Sally-Anne Wamble Shanta 01/10/2018, 1:31 PM

## 2018-01-11 DIAGNOSIS — F419 Anxiety disorder, unspecified: Secondary | ICD-10-CM

## 2018-01-11 MED ORDER — DIVALPROEX SODIUM 500 MG PO DR TAB: 500.0000 mg | DELAYED_RELEASE_TABLET | Freq: Two times a day (BID) | ORAL | 0 refills | Status: DC

## 2018-01-11 MED ORDER — SERTRALINE HCL 50 MG PO TABS: 50.0000 mg | ORAL_TABLET | Freq: Every day | ORAL | 0 refills | Status: DC

## 2018-01-11 MED ORDER — AMLODIPINE BESYLATE 10 MG PO TABS: 10.0000 mg | ORAL_TABLET | Freq: Every day | ORAL | 0 refills | Status: DC

## 2018-01-11 MED ORDER — TRAZODONE HCL 50 MG PO TABS: 50.0000 mg | ORAL_TABLET | Freq: Every evening | ORAL | 0 refills | Status: DC | PRN

## 2018-01-11 MED ORDER — AMLODIPINE BESYLATE 5 MG PO TABS: 5.0000 mg | ORAL_TABLET | Freq: Every day | ORAL | 0 refills | Status: DC

## 2018-01-11 MED ORDER — AMLODIPINE BESYLATE 10 MG PO TABS
10.0000 mg | ORAL_TABLET | Freq: Every day | ORAL | Status: DC
  Filled 2018-01-11 (×2): qty 1

## 2018-01-11 MED ORDER — NICOTINE POLACRILEX 2 MG MT GUM: 2.0000 mg | CHEWING_GUM | OROMUCOSAL | 0 refills | Status: DC | PRN

## 2018-01-11 MED ORDER — HYDROXYZINE HCL 50 MG PO TABS: 50.0000 mg | ORAL_TABLET | Freq: Four times a day (QID) | ORAL | 0 refills | Status: DC | PRN

## 2018-01-11 NOTE — Progress Notes (Signed)
[facility] MD Progress Note  01/11/2018 9:25 AM Matthew Kaufman  MRN:  [id number]   Subjective: patient reports partial improvement since admission, and currently describes improving mood and denies suicidal ideations. He is future oriented, and focuses on disposition planning. He states he wants to go to [facility] for further residential treatment , and states he feels he will be at higher risk of relapsing if not discharging to a residential /sober setting. Denies medication side effects- on Depakote .  Denies suicidal ideations  History: I have discussed case with treatment team and have met with patient. 47 year old male, history of polysubstance use disorder- identifies alcohol as substance of choice. History of mood disorder and has been diagnosed with Bipolar Disorder in the past .  Currently endorses partially improved mood and acknowledges he is feeling better than he did on admission. At this time denies suicidal or homicidal/violent ideations.  He presents vaguely irritable, but behavior in good control, and affect tends to improve during session. Visible in day room, interacting with peers . No disruptive or agitated behaviors .  Presents future oriented and focusing on disposition planning, as above- wants to go to [facility]. History of HTN - on Norvasc. BP partially improved, but still elevated today at 142/98. No associated symptoms.   Principal Problem: PTSD (post-traumatic stress disorder) Diagnosis: Principal Problem:   PTSD (post-traumatic stress disorder) Active Problems:   Bipolar II disorder, severe, depressed, with anxious distress (HCC)   Polysubstance dependence including opioid type drug, episodic abuse ([facility])  Total Time spent with patient: 20 minutes  Past Psychiatric History:   Past Medical History:  Past Medical History:  Diagnosis Date  . Hypertension     Past Surgical History:  Procedure Laterality Date  . APPENDECTOMY     Family History: History reviewed. No  pertinent family history. Family Psychiatric  History:  Social History:  Social History   Substance and Sexual Activity  Alcohol Use Yes  . Alcohol/week: 6.0 standard drinks  . Types: 6 Glasses of wine per week     Social History   Substance and Sexual Activity  Drug Use Yes  . Types: Marijuana, Cocaine    Social History   Socioeconomic History  . Marital status: Married    Spouse name: Not on file  . Number of children: Not on file  . Years of education: Not on file  . Highest education level: Not on file  Occupational History  . Not on file  Social Needs  . Financial resource strain: Hard  . Food insecurity:    Worry: Not on file    Inability: Not on file  . Transportation needs:    Medical: Not on file    Non-medical: Not on file  Tobacco Use  . Smoking status: Current Every Day Smoker    Packs/day: 0.50    Types: Cigarettes  . Smokeless tobacco: Never Used  Substance and Sexual Activity  . Alcohol use: Yes    Alcohol/week: 6.0 standard drinks    Types: 6 Glasses of wine per week  . Drug use: Yes    Types: Marijuana, Cocaine  . Sexual activity: Yes  Lifestyle  . Physical activity:    Days per week: Not on file    Minutes per session: Not on file  . Stress: Not on file  Relationships  . Social connections:    Talks on phone: Not on file    Gets together: Not on file    Attends religious service: Not on  file    Active member of club or organization: Not on file    Attends meetings of clubs or organizations: Not on file    Relationship status: Not on file  Other Topics Concern  . Not on file  Social History Narrative  . Not on file   Additional Social History:    Pain Medications: see MAR Prescriptions: see MAR Over the Counter: see MAR History of alcohol / drug use?: Yes Longest period of sobriety (when/how long): Patient has no history of complete sobriety, states that he was clean off cocaine for several years Negative Consequences of Use:  Financial, Legal, Personal relationships, Work / School Name of Substance 1: cocaine 1 - Age of First Use: 11 1 - Amount (size/oz): binges, amt undetermined 1 - Frequency: daily for past three days 1 - Duration: since onset with some years clean recently 1 - Last Use / Amount: last pm, undetermined amount Name of Substance 2: alcohol 2 - Age of First Use: 6 2 - Amount (size/oz): 18 beers 2 - Frequency: daily 2 - Duration: since onset 2 - Last Use / Amount: last pm Name of Substance 3: Marijuana 3 - Age of First Use: 6 3 - Amount (size/oz): 3 grams 3 - Frequency: daily 3 - Duration: since onset 3 - Last Use / Amount: yesterday  Sleep: fair- improving  Appetite:  improving   Current Medications: Current Facility-Administered Medications  Medication Dose Route Frequency Provider Last Rate Last Dose  . acetaminophen (TYLENOL) tablet 650 mg  650 mg Oral Q6H PRN Money, Lowry Ram, FNP   650 mg at 01/10/18 2118  . alum & mag hydroxide-simeth (MAALOX/MYLANTA) 200-200-20 MG/5ML suspension 30 mL  30 mL Oral Q4H PRN Money, Darnelle Maffucci B, FNP      . amLODipine (NORVASC) tablet 5 mg  5 mg Oral Daily Patriciaann Clan E, PA-C   5 mg at 01/11/18 0730  . divalproex (DEPAKOTE) DR tablet 500 mg  500 mg Oral Q12H Pennelope Bracken, MD   500 mg at 01/11/18 0730  . hydrOXYzine (ATARAX/VISTARIL) tablet 50 mg  50 mg Oral Q6H PRN Pennelope Bracken, MD   50 mg at 01/07/18 0813  . ibuprofen (ADVIL,MOTRIN) tablet 800 mg  800 mg Oral Q6H PRN Pennelope Bracken, MD   800 mg at 01/11/18 0519  . Influenza vac split quadrivalent PF (FLUARIX) injection 0.5 mL  0.5 mL Intramuscular Tomorrow-1000 [place], Christopher T, MD      . magnesium hydroxide (MILK OF MAGNESIA) suspension 30 mL  30 mL Oral Daily PRN Money, Lowry Ram, FNP      . multivitamin with minerals tablet 1 tablet  1 tablet Oral Daily Money, Lowry Ram, FNP   1 tablet at 01/11/18 0731  . nicotine polacrilex (NICORETTE) gum 2 mg  2 mg Oral PRN  Pennelope Bracken, MD   2 mg at 01/11/18 0731  . thiamine (B-1) injection 100 mg  100 mg Intramuscular Once Money, Darnelle Maffucci B, FNP      . thiamine (VITAMIN B-1) tablet 100 mg  100 mg Oral Daily Money, Lowry Ram, FNP   100 mg at 01/11/18 0731  . traZODone (DESYREL) tablet 50 mg  50 mg Oral QHS PRN Money, Lowry Ram, FNP        Lab Results:  No results found for this or any previous visit (from the past 48 hour(s)).  Blood Alcohol level:  Lab Results  Component Value Date   ETH <10 01/05/2018   ETH  201 (H) 33/54/5625    Metabolic Disorder Labs: Lab Results  Component Value Date   HGBA1C 6.1 (H) 01/05/2018   MPG 128.37 01/05/2018   MPG 134.11 12/23/2016   Lab Results  Component Value Date   PROLACTIN 15.9 (H) 12/23/2016   Lab Results  Component Value Date   CHOL 134 01/05/2018   TRIG 57 01/05/2018   HDL 55 01/05/2018   CHOLHDL 2.4 01/05/2018   VLDL 11 01/05/2018   LDLCALC 68 01/05/2018   LDLCALC 85 12/23/2016    Physical Findings: AIMS: Facial and Oral Movements Muscles of Facial Expression: None, normal Lips and Perioral Area: None, normal Jaw: None, normal Tongue: None, normal,Extremity Movements Upper (arms, wrists, hands, fingers): None, normal Lower (legs, knees, ankles, toes): None, normal, Trunk Movements Neck, shoulders, hips: None, normal, Overall Severity Severity of abnormal movements (highest score from questions above): None, normal Incapacitation due to abnormal movements: None, normal Patient's awareness of abnormal movements (rate only patient's report): No Awareness, Dental Status Current problems with teeth and/or dentures?: No Does patient usually wear dentures?: No  CIWA:  CIWA-Ar Total: 1 COWS:  COWS Total Score: 1  Musculoskeletal: Strength & Muscle Tone: within normal limits- no tremors, no diaphoresis, no psychomotor agitation.  Gait & Station: normal Patient leans: N/A  Psychiatric Specialty Exam: Physical Exam  Vitals  reviewed. Constitutional: He appears well-developed.  Psychiatric: He has a normal mood and affect. His behavior is normal.    Review of Systems  Psychiatric/Behavioral: Positive for depression and substance abuse. The patient is nervous/anxious.   All other systems reviewed and are negative. no headache, no chest pain, no dyspnea  Blood pressure (!) 142/98, pulse 68, temperature 98.7 F (37.1 C), temperature source Oral, resp. rate 18, height 5' 9"  (1.753 m), weight 95.7 kg, SpO2 99 %.Body mass index is 31.16 kg/m.  General Appearance: Casual  Eye Contact:  Good  Speech:  Normal Rate  Volume:  Normal  Mood:  partially improved   Affect:  vaguely irritable, improves during session, smiles appropriately at times   Thought Process:  Linear and Descriptions of Associations: Intact  Orientation:  Other:  fully alert and attentive  Thought Content:  no hallucinations, no delusions, not internally preoccupied   Suicidal Thoughts:  No denies suicidal or self injurious ideations, denies homicidal or violent ideations towards anyone   Homicidal Thoughts:  No  Memory:  recent and remote grossly intact   Judgement:  Improving   Insight:  Fair- improving   Psychomotor Activity:  Normal- no current restlessness or agitation  Concentration:  Concentration: Good and Attention Span: Good  Recall:  Good  Fund of Knowledge:  Good  Language:  Good  Akathisia:  No  Handed:  Right  AIMS (if indicated):     Assets:  Communication Skills Desire for Improvement Social Support  ADL's:  Intact  Cognition:  WNL  Sleep:  Number of Hours: 5   Assessment - 48 year old male with history of Alcohol, Cocaine,  Cannabis Use Disorder, Mood Disorder ( with history of Bipolar Disorder diagnosis ) , PTSD. Presented for worsening depression, anxiety, HI towards GF. Admission UDS positive for cocaine and cannabis. Currently reports partial improvement , denies SI or any  HI, endorses improving mood, behavior in  good control, and presents future oriented , focusing on going to [place] ( residential rehab) . Tolerating Depakote well thus far.    Treatment Plan Summary: Daily contact with patient to assess and evaluate symptoms and progress in  treatment and Medication management  Treatment plan reviewed as below today 11/25 Encourage group and milieu participation  Encourage efforts to work on sobriety Continue  Depakote 500 mg BID for mood disorder Increase Norvasc to 10 mgrs  QDAY for HTN Continue Trazodone 50 mgrs QHS PRN for insomnia  Continue Vistaril 50 mgrs Q 6 hours PRN for anxiety Treatment team working on disposition planning- patient wanting to go to [facility]  Check Valproic Acid Serum level in AM   Jenne Campus, MD 01/11/2018, 9:25 AM   Patient ID: Tilman Neat, male   DOB: [birth year], 47 y.o.   MRN: [id number]

## 2018-01-11 NOTE — Progress Notes (Signed)
D:  Patient's self inventory sheet, patient has fair sleep, no sleep medication.  Fair appetite, normal energy level, good concentration.  Rated depression, hopeless and anxiety 3.  Withdrawals, agitation, irritability.  Denied SI.  Physical problems, pain, knee, worst pain #7.5 in past 24 hours.  Pain medication helpful.  Goal is not get angry.  Plans to stay positive.  Does have discharge plans. A:  Medications administered per MD orders.  Emotional support and encouragement given patient. R:  Patient denied SI and HI, contracts for safety.   Denied A/V hallucinations.  Safety maintained with 15 minute checks.

## 2018-01-11 NOTE — B[facility] Suicide Risk Assessment (Signed)
[facility]   Principal Problem: PTSD (post-traumatic stress disorder) Discharge Diagnoses: Principal Problem:   PTSD (post-traumatic stress disorder) Active Problems:   Bipolar II disorder, severe, depressed, with anxious distress (HCC)   Polysubstance dependence including opioid type drug, episodic abuse (HCC)   Total Time spent with patient: 30 minutes  Musculoskeletal: Strength & Muscle Tone: within normal limits Gait & Station: normal Patient leans: N/A  Psychiatric Specialty Exam: ROS no headaches, no chest pain, no shortness of breath, no vomiting  Blood pressure (!) 142/98, pulse 68, temperature 98.7 F (37.1 C), temperature source Oral, resp. rate 18, height 5\' 9"  (1.753 m), weight 95.7 kg, SpO2 99 %.Body mass index is 31.16 kg/m.  General Appearance: Casual  Eye Contact::  Good  Speech:  Normal Rate409  Volume:  Normal  Mood:  improved   Affect:  Appropriate and reactive  Thought Process:  Linear and Descriptions of Associations: Intact  Orientation:  Full (Time, Place, and Person)  Thought Content:  No hallucinations, no delusions, not internally preoccupied  Suicidal Thoughts:  No denies suicidal or self-injurious ideations, no homicidal or violent ideations, future oriented  Homicidal Thoughts:  No  Memory:  Recent and remote grossly intact  Judgement:  Other:  Improving  Insight:  Improving  Psychomotor Activity:  Normal-no restlessness or agitation at this time  Concentration:  Good  Recall:  Good  Fund of Knowledge:Good  Language: Negative  Akathisia:  Negative  Handed:  Right  AIMS (if indicated):     Assets:  Communication Skills Desire for Improvement Social Support  Sleep:  Number of Hours: 5  Cognition: WNL  ADL's:  Intact   Mental Status Per Nursing Assessment::   On Admission:  Thoughts of violence towards others  Demographic Factors:  47 year old male  Loss Factors: Substance abuse, relationship  stressors  Historical Factors: History of mood disorder ( Bipolar Disorder ) , PTSD , substance abuse   Risk Reduction Factors:   Positive coping skills or problem solving skills  Continued Clinical Symptoms:  At this time patient is alert, attentive, reports improving mood.  Affect is improving/fuller in range.  [facility], and presents future oriented.  No thought disorder. No suicidal or self injurious ideations, no homicidal or violent ideations, no hallucinations, no delusions , future oriented . Denies medication side effects- tolerating Depakote trial well thus far . No disruptive behaviors on unit.  Cognitive Features That Contribute To Risk:  No gross cognitive deficits noted upon discharge. Is alert , attentive, and oriented x 3    Suicide Risk:  Mild:  Suicidal ideation of limited frequency, intensity, duration, and specificity.  There are no identifiable plans, no associated intent, mild dysphoria and related symptoms, good self-control (both objective and subjective assessment), few other risk factors, and identifiable protective factors, including available and accessible social support.  Follow-up Information    [facility]. Go on 01/11/2018.   Specialty:  Behavioral Health Why:  [facility], during walk in hours. Monday-Friday 8:00a.-3:00p. Bring: photo ID, proof of insurance, social security card, and any discharge paperwork from this hospitalization. Contact informationElpidio Eric: [address] [place] [address] [phone]        [facility] Follow up.   Specialty:  Addiction Medicine Why:  [facility] will pick you up Monday, 01/11/18 at 8:30p.  Contact information: [address] [place] [address] [phone]  [facility]. Go on 01/19/2018.   Why:  Please attend you screening appointment on Tuesday, 01/19/18 at 7:45a. Please bring: photo ID, proof of insurance,  and medications. Contact information: [address] [place] [address] [phone]           Plan Of Care/Follow-up recommendations:  Activity:  as tolerated  Diet:  heart healthy Tests:  NA Other:  See below  Informed by CSW that patient has been accepted by [facility] and being picked up to go to [facility] later this evening , patient motivated in and in agreement with this disposition plan. Leaving unit in good spirits .   Craige Cotta, MD 01/11/2018, 1:25 PM

## 2018-01-11 NOTE — Progress Notes (Signed)
  [facility] :  Will you be returning to the same living situation after discharge:  No. [facility]  At discharge, do you have transportation home?: Yes,  [facility] driver will pick him up at 8:30PM on 11/25 Do you have the ability to pay for your medications: Yes,  mental health  Release of information consent forms completed and submitted to medical records by CSW.   Patient to Follow up at: Follow-up Information    [facility]. Go on 01/11/2018.   Specialty:  Behavioral Health Why:  [facility] during [facility] hours. Monday-Friday 8:00a.-3:00p. Bring: photo ID, proof of insurance, social security card, and any discharge paperwork from this hospitalization. Contact informationElpidio Eric: [address] [place] [address] [phone]        [facility] Follow up.   Specialty:  Addiction Medicine Why:  [facility] will pick you up Monday, 01/11/18 at 8:30p.  Contact information: [address] [place] [address] [phone]        [facility]. Go on 01/19/2018.   Why:  Please attend you screening appointment on Tuesday, 01/19/18 at 7:45a. Please bring: photo ID, proof of insurance, and medications. Contact information: [address] [place] [address] [phone]           Next level of care provider has access to [facility] Link:no  Safety Planning and Suicide Prevention discussed: Yes,  SPE completed with pt's mother and with pt. SPI pamphlet and Mobile Crisis information provided to pt.   Have you used any form of tobacco in the last 30 days? (Cigarettes, Smokeless Tobacco, Cigars, and/or Pipes): Yes  Has patient been referred to the Quitline?: Patient refused referral  Patient has been referred for addiction treatment: Yes  Rona RavensHeather S Early Steel, LCSW 01/11/2018, 1:30 PM
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
## 2018-01-11 NOTE — Progress Notes (Signed)
Recreation Therapy Notes  Date: 11.25.19 Time: 0930 Location: 300 Hall Dayroom  Group Topic: Stress Management  Goal Area(s) Addresses:  Patient will verbalize importance of using healthy stress management.  Patient will identify positive emotions associated with healthy stress management.   Intervention: Stress Management  Activity :  Guided Imagery.  LRT introduced the stress management technique of guided imagery to patients.  LRT read a script that allowed patients to envision being outside looking at a starry sky at night.  Patients were to listen and follow along as script was read to engage in activity.  Education: Stress Management, Discharge Planning.   Education Outcome: Acknowledges edcuation/In group clarification offered/Needs additional education  Clinical Observations/Feedback: Pt did not attend group.     Mallery Harshman, LRT/CTRS         Jaken Fregia A 01/11/2018 11:27 AM 

## 2018-01-11 NOTE — Progress Notes (Signed)
Discharge Note:  Patient discharged with friend. Patient denied SI and HI.  Denied A/V hallucinations.  Suicide prevention information given and discussed with patient who stated he understood and had no questions.  My3 suicide information also given to patient.  Patient stated he received all his belongings, clothing, toiletries, misc items, prescriptions, medications, etc.   Patient stated he appreciated all [facility].  All required discharge information given to patient at discharge.  All discharge survey given to patient.   RN called [facility] and told them that [facility] and there is no need for [facility] to come and pick up this patient tonight.  RN talked to [place]anika and stated this patient had called and canceled his appointment with [facility] today.

## 2019-03-13 ENCOUNTER — Ambulatory Visit: Payer: Self-pay | Attending: Internal Medicine

## 2019-03-13 DIAGNOSIS — Z23 Encounter for immunization: Secondary | ICD-10-CM | POA: Insufficient documentation

## 2019-03-15 NOTE — Progress Notes (Signed)
   Covid-19 Vaccination Clinic  Name:  Matthew Kaufman    MRN: [id number] DOB: Feb 16, 1971  03/13/2019  Matthew Kaufman was observed post Covid-19 immunization for 15 minutes without incidence. He was provided with Vaccine Information Sheet and instruction to access the V-Safe system.   Matthew Kaufman was instructed to call 911 with any severe reactions post vaccine: Marland Kitchen Difficulty breathing  . Swelling of your face and throat  . A fast heartbeat  . A bad rash all over your body  . Dizziness and weakness    Immunizations Administered    Name Date Dose VIS Date Route   Moderna COVID-19 Vaccine 03/13/2019  5:15 PM 0.5 mL 01/18/2019 Intramuscular   Manufacturer: Gala Murdoch   Lot: [id number]   NDC: 72897-915-04      Documented on behalf of:  C. Jimmey Ralph

## 2019-04-10 ENCOUNTER — Ambulatory Visit: Payer: Self-pay | Attending: Internal Medicine

## 2019-04-10 ENCOUNTER — Other Ambulatory Visit: Payer: Self-pay

## 2019-04-10 ENCOUNTER — Ambulatory Visit: Payer: Self-pay

## 2019-04-10 DIAGNOSIS — Z23 Encounter for immunization: Secondary | ICD-10-CM | POA: Insufficient documentation

## 2019-04-10 NOTE — Progress Notes (Signed)
   Covid-19 Vaccination Clinic  Name:  Matthew Kaufman    MRN: [id number] DOB: 1970-05-31  04/10/2019  Mr. Matthew Kaufman was observed post Covid-19 immunization for 15 minutes without incidence. He was provided with Vaccine Information Sheet and instruction to access the V-Safe system.   Mr. Matthew Kaufman was instructed to call 911 with any severe reactions post vaccine: Marland Kitchen Difficulty breathing  . Swelling of your face and throat  . A fast heartbeat  . A bad rash all over your body  . Dizziness and weakness    Immunizations Administered    Name Date Dose VIS Date Route   Moderna COVID-19 Vaccine 04/10/2019  5:29 PM 0.5 mL 01/18/2019 Intramuscular   Manufacturer: Moderna   Lot: [id number]   NDC: 28366-294-76

## 2019-05-02 ENCOUNTER — Emergency Department (HOSPITAL_COMMUNITY)
Admission: EM | Admit: 2019-05-02 | Discharge: 2019-05-03 | Disposition: A | Discharge status: Home / Self Care | Payer: Self-pay | Attending: Emergency Medicine | Admitting: Emergency Medicine

## 2019-05-02 ENCOUNTER — Other Ambulatory Visit: Payer: Self-pay

## 2019-05-02 ENCOUNTER — Encounter (HOSPITAL_COMMUNITY): Payer: Self-pay | Admitting: Emergency Medicine

## 2019-05-02 DIAGNOSIS — M25562 Pain in left knee: Secondary | ICD-10-CM | POA: Insufficient documentation

## 2019-05-02 DIAGNOSIS — I10 Essential (primary) hypertension: Secondary | ICD-10-CM | POA: Insufficient documentation

## 2019-05-02 DIAGNOSIS — F10920 Alcohol use, unspecified with intoxication, uncomplicated: Secondary | ICD-10-CM | POA: Insufficient documentation

## 2019-05-02 DIAGNOSIS — G8929 Other chronic pain: Secondary | ICD-10-CM

## 2019-05-02 DIAGNOSIS — M25512 Pain in left shoulder: Secondary | ICD-10-CM | POA: Insufficient documentation

## 2019-05-02 DIAGNOSIS — Z653 Problems related to other legal circumstances: Secondary | ICD-10-CM

## 2019-05-02 DIAGNOSIS — F17210 Nicotine dependence, cigarettes, uncomplicated: Secondary | ICD-10-CM | POA: Insufficient documentation

## 2019-05-02 DIAGNOSIS — Z79899 Other long term (current) drug therapy: Secondary | ICD-10-CM | POA: Insufficient documentation

## 2019-05-02 NOTE — ED Notes (Signed)
Pt walked to the bathroom escorted by [employer]. Pt requested that I take the cuffs off because they were too tight. This writer told the pt that I had no control over the hand cuffs. Pt became irritated and then urinated in the toilet as well as on top of the toilet. Urine sample and culture collected by this Clinical research associate.

## 2019-05-02 NOTE — ED Triage Notes (Signed)
BIB by EMS in police custody. In transport with Police. Admits to alcohol use; 1 case of beer and 2 fifths a liquor. Tackled by Police would not communicate with EMS. Did complain of right leg pain.   150/90 100% 90

## 2019-05-02 NOTE — ED Notes (Signed)
Pt asked to use the BR. Urinal was offered to pt but pt refused to use urinal with assist from staff. Pt states "I'll pee on myself but I'm not using the urinal with your help".

## 2019-05-02 NOTE — ED Notes (Signed)
Pt flipped himself out of the bed with the railing up. Pt assisted back into the bed. [employer] at bedside.

## 2019-05-03 NOTE — ED Notes (Signed)
Pt yelling at [employer] from bed. Pt has been asked to lower his voice but continues to yell.

## 2019-05-03 NOTE — ED Provider Notes (Signed)
[facility]-EMERGENCY DEPT Provider Note  CSN: [id number] Arrival date & time: 05/02/19 2335  Chief Complaint(s) Alcohol Intoxication and Leg Pain  HPI Matthew Kaufman is a 49 y.o. male with a past medical history listed below including polysubstance abuse and alcohol abuse who presents to the emergency department in [employer] custody.  [employer] reports that the patient asked to be brought for evaluation.  Patient adamantly denies wanting to come to the emergency department.  He is complaining of chronic left knee pain.  He is also complaining of left shoulder pain. Pain is mild.  He does endorse to alcohol use.  Denies any other physical complaints.  HPI  Past Medical History Past Medical History:  Diagnosis Date  . Hypertension    Patient Active Problem List   Diagnosis Date Noted  . Bipolar II disorder, severe, depressed, with anxious distress (HCC) 01/04/2018  . PTSD (post-traumatic stress disorder) 01/04/2018  . Polysubstance dependence including opioid type drug, episodic abuse (HCC) 01/04/2018  . Substance induced mood disorder (HCC) 12/23/2016  . Alcohol use disorder, severe, dependence (HCC) 12/23/2016  . HTN (hypertension) 05/08/2016  . Depression with anxiety 05/08/2016  . Insomnia 05/08/2016  . Substance abuse (HCC) 05/08/2016  . Cracked tooth 05/08/2016  . Acne 05/08/2016   Home Medication(s) Prior to Admission medications   Medication Sig Start Date End Date Taking? Authorizing Provider  albuterol (PROVENTIL HFA;VENTOLIN HFA) 108 (90 Base) MCG/ACT inhaler Inhale 1-2 puffs into the lungs every 6 (six) hours as needed for wheezing or shortness of breath.    [provider]  amLODipine (NORVASC) 10 MG tablet Take 1 tablet (10 mg total) by mouth daily. For high blood pressure 01/12/18   Armandina Stammer I, NP  divalproex (DEPAKOTE) 500 MG DR tablet Take 1 tablet (500 mg total) by mouth every 12 (twelve) hours. 01/11/18   Oneta Rack, NP  hydrOXYzine  (ATARAX/VISTARIL) 50 MG tablet Take 1 tablet (50 mg total) by mouth every 6 (six) hours as needed for anxiety. 01/11/18   Oneta Rack, NP  nicotine polacrilex (NICORETTE) 2 MG gum Take 1 each (2 mg total) by mouth as needed for smoking cessation. 01/11/18   Oneta Rack, NP  sertraline (ZOLOFT) 50 MG tablet Take 1 tablet (50 mg total) by mouth daily. For depression 01/11/18   Oneta Rack, NP  traZODone (DESYREL) 50 MG tablet Take 1 tablet (50 mg total) by mouth at bedtime as needed for sleep. 01/11/18   Oneta Rack, NP                                                                                                                                    Past Surgical History Past Surgical History:  Procedure Laterality Date  . APPENDECTOMY     Family History No family history on file.  Social History Social History   Tobacco Use  . Smoking status: Current Every  Day Smoker    Packs/day: 0.50    Types: Cigarettes  . Smokeless tobacco: Never Used  Substance Use Topics  . Alcohol use: Yes    Alcohol/week: 6.0 standard drinks    Types: 6 Glasses of wine per week  . Drug use: Yes    Types: Marijuana, Cocaine   Allergies Amoxicillin and Doxycycline  Review of Systems Review of Systems As noted in HPI  Physical Exam Vital Signs  I have reviewed the triage vital signs BP 126/69 (BP Location: Right Arm)   Pulse 91   Temp 98.1 F (36.7 C) (Oral)   Resp 17   Ht 5\' 9"  (1.753 m)   Wt 93.9 kg   SpO2 96%   BMI 30.57 kg/m   Physical Exam Constitutional:      General: He is not in acute distress.    Appearance: He is well-developed. He is not diaphoretic.  HENT:     Head: Normocephalic.     Right Ear: External ear normal.     Left Ear: External ear normal.  Eyes:     General: No scleral icterus.       Right eye: No discharge.        Left eye: No discharge.     Conjunctiva/sclera: Conjunctivae normal.     Pupils: Pupils are equal, round, and reactive to light.    Cardiovascular:     Rate and Rhythm: Regular rhythm.     Pulses:          Radial pulses are 2+ on the right side and 2+ on the left side.       Dorsalis pedis pulses are 2+ on the right side and 2+ on the left side.     Heart sounds: Normal heart sounds. No murmur. No friction rub. No gallop.   Pulmonary:     Effort: Pulmonary effort is normal. No respiratory distress.     Breath sounds: Normal breath sounds. No stridor.  Abdominal:     General: There is no distension.     Palpations: Abdomen is soft.     Tenderness: There is no abdominal tenderness.  Musculoskeletal:     Right shoulder: No tenderness or bony tenderness. Normal range of motion.     Left shoulder: No swelling, deformity, tenderness, bony tenderness or crepitus. Normal range of motion. Normal strength. Normal pulse.     Cervical back: Normal range of motion and neck supple. No bony tenderness.     Thoracic back: No bony tenderness.     Lumbar back: No bony tenderness.     Right knee: No tenderness.     Left knee: No swelling, deformity, erythema, ecchymosis, lacerations, bony tenderness or crepitus. Normal range of motion. Tenderness present.     Comments: Clavicle stable. Chest stable to AP/Lat compression. Pelvis stable to Lat compression. No obvious extremity deformity. No chest or abdominal wall contusion.  Skin:    General: Skin is warm.  Neurological:     Mental Status: He is alert and oriented to person, place, and time.     GCS: GCS eye subscore is 4. GCS verbal subscore is 5. GCS motor subscore is 6.     Comments: Moving all extremities      ED Results and Treatments Labs (all labs ordered are listed, but only abnormal results are displayed) Labs Reviewed - No data to display  EKG  EKG Interpretation  Date/Time:    Ventricular Rate:    PR Interval:    QRS Duration:   QT  Interval:    QTC Calculation:   R Axis:     Text Interpretation:        Radiology No results found.  Pertinent labs & imaging results that were available during my care of the patient were reviewed by me and considered in my medical decision making (see chart for details).  Medications Ordered in ED Medications - No data to display                                                                                                                                  Procedures Procedures  (including critical care time)  Medical Decision Making / ED Course I have reviewed the nursing notes for this encounter and the patient's prior records (if available in EHR or on provided paperwork).   Ricahrd Schwager was evaluated in Emergency Department on 05/03/2019 for the symptoms described in the history of present illness. He was evaluated in the context of the global COVID-19 pandemic, which necessitated consideration that the patient might be at risk for infection with the SARS-CoV-2 virus that causes COVID-19. Institutional protocols and algorithms that pertain to the evaluation of patients at risk for COVID-19 are in a state of rapid change based on information released by regulatory bodies including the CDC and federal and state organizations. These policies and algorithms were followed during the patient's care in the ED.  Alcohol intoxication in [employer] custody. Exam reassuring and not suspicious for serious bony/joint injury. Ambulates w/o complication.       Final Clinical Impression(s) / ED Diagnoses Final diagnoses:  Alcoholic intoxication without complication (HCC)  In police custody  Chronic pain of left knee  Left shoulder pain, unspecified chronicity   The patient appears reasonably screened and/or stabilized for discharge and I doubt any other medical condition or other [facility] requiring further screening, evaluation, or treatment in the ED at this time prior to discharge. Safe for  discharge with strict return precautions.  Disposition: Discharge  Condition: Good  I have discussed the results, Dx and Tx plan with the patient/family who expressed understanding and agree(s) with the plan. Discharge instructions discussed at length. The patient/family was given strict return precautions who verbalized understanding of the instructions. No further questions at time of discharge.    ED Discharge Orders    None       Follow Up: Loletta Specter, PA-C [address] [place] [address] [phone]   For help establishing care with a care provider      This chart was dictated using voice recognition software.  Despite best efforts to proofread,  errors can occur which can change the documentation meaning.   Nira Conn, MD 05/03/19 [phone]

## 2019-05-03 NOTE — ED Notes (Signed)
Discharge paper given

## 2019-06-07 ENCOUNTER — Ambulatory Visit (INDEPENDENT_AMBULATORY_CARE_PROVIDER_SITE_OTHER): Payer: Self-pay | Admitting: Primary Care

## 2019-07-03 ENCOUNTER — Other Ambulatory Visit: Payer: Self-pay

## 2019-07-03 ENCOUNTER — Emergency Department (HOSPITAL_COMMUNITY)
Admission: EM | Admit: 2019-07-03 | Discharge: 2019-07-04 | Disposition: A | Discharge status: Home / Self Care | Payer: Self-pay | Attending: Emergency Medicine | Admitting: Emergency Medicine

## 2019-07-03 DIAGNOSIS — F1910 Other psychoactive substance abuse, uncomplicated: Secondary | ICD-10-CM

## 2019-07-03 DIAGNOSIS — Z79899 Other long term (current) drug therapy: Secondary | ICD-10-CM | POA: Insufficient documentation

## 2019-07-03 DIAGNOSIS — F17210 Nicotine dependence, cigarettes, uncomplicated: Secondary | ICD-10-CM | POA: Insufficient documentation

## 2019-07-03 DIAGNOSIS — F333 Major depressive disorder, recurrent, severe with psychotic symptoms: Secondary | ICD-10-CM | POA: Insufficient documentation

## 2019-07-03 DIAGNOSIS — F1994 Other psychoactive substance use, unspecified with psychoactive substance-induced mood disorder: Secondary | ICD-10-CM | POA: Diagnosis present

## 2019-07-03 DIAGNOSIS — F1020 Alcohol dependence, uncomplicated: Secondary | ICD-10-CM | POA: Insufficient documentation

## 2019-07-03 DIAGNOSIS — F1420 Cocaine dependence, uncomplicated: Secondary | ICD-10-CM | POA: Insufficient documentation

## 2019-07-03 DIAGNOSIS — F1120 Opioid dependence, uncomplicated: Secondary | ICD-10-CM | POA: Diagnosis present

## 2019-07-03 DIAGNOSIS — Z20822 Contact with and (suspected) exposure to covid-19: Secondary | ICD-10-CM | POA: Insufficient documentation

## 2019-07-03 DIAGNOSIS — I10 Essential (primary) hypertension: Secondary | ICD-10-CM | POA: Insufficient documentation

## 2019-07-03 DIAGNOSIS — F4310 Post-traumatic stress disorder, unspecified: Secondary | ICD-10-CM | POA: Diagnosis present

## 2019-07-03 DIAGNOSIS — R443 Hallucinations, unspecified: Secondary | ICD-10-CM

## 2019-07-03 DIAGNOSIS — F1920 Other psychoactive substance dependence, uncomplicated: Secondary | ICD-10-CM | POA: Diagnosis present

## 2019-07-03 LAB — RAPID URINE DRUG SCREEN, HOSP PERFORMED
Amphetamines: NOT DETECTED
Barbiturates: NOT DETECTED
Benzodiazepines: NOT DETECTED
Cocaine: POSITIVE — AB
Opiates: NOT DETECTED
Tetrahydrocannabinol: POSITIVE — AB

## 2019-07-03 LAB — CBC
HCT: 39.8 % (ref 39.0–52.0)
Hemoglobin: 13.1 g/dL (ref 13.0–17.0)
MCH: 27.9 pg (ref 26.0–34.0)
MCHC: 32.9 g/dL (ref 30.0–36.0)
MCV: 84.7 fL (ref 80.0–100.0)
Platelets: 272 10*3/uL (ref 150–400)
RBC: 4.7 MIL/uL (ref 4.22–5.81)
RDW: 14.6 % (ref 11.5–15.5)
WBC: 11.6 10*3/uL — ABNORMAL HIGH (ref 4.0–10.5)
nRBC: 0 % (ref 0.0–0.2)

## 2019-07-03 LAB — COMPREHENSIVE METABOLIC PANEL
ALT: 29 U/L (ref 0–44)
AST: 55 U/L — ABNORMAL HIGH (ref 15–41)
Albumin: 5.1 g/dL — ABNORMAL HIGH (ref 3.5–5.0)
Alkaline Phosphatase: 58 U/L (ref 38–126)
Anion gap: 15 (ref 5–15)
BUN: 10 mg/dL (ref 6–20)
CO2: 23 mmol/L (ref 22–32)
Calcium: 9.3 mg/dL (ref 8.9–10.3)
Chloride: 102 mmol/L (ref 98–111)
Creatinine, Ser: 0.85 mg/dL (ref 0.61–1.24)
GFR calc Af Amer: >60 mL/min (ref >60)
GFR calc non Af Amer: >60 mL/min (ref >60)
Glucose, Bld: 108 mg/dL — ABNORMAL HIGH (ref 70–99)
Potassium: 3.2 mmol/L — ABNORMAL LOW (ref 3.5–5.1)
Sodium: 140 mmol/L (ref 135–145)
Total Bilirubin: 0.8 mg/dL (ref 0.3–1.2)
Total Protein: 7.9 g/dL (ref 6.5–8.1)

## 2019-07-03 LAB — ACETAMINOPHEN LEVEL: Acetaminophen (Tylenol), Serum: <10 ug/mL — ABNORMAL LOW (ref 10–30)

## 2019-07-03 LAB — ETHANOL: Alcohol, Ethyl (B): 37 mg/dL — ABNORMAL HIGH (ref <10)

## 2019-07-03 LAB — SARS CORONAVIRUS 2 BY RT PCR (HOSPITAL ORDER, PERFORMED IN ~~LOC~~ HOSPITAL LAB): SARS Coronavirus 2: NEGATIVE

## 2019-07-03 LAB — SALICYLATE LEVEL: Salicylate Lvl: <7 mg/dL — ABNORMAL LOW (ref 7.0–30.0)

## 2019-07-03 MED ORDER — DIVALPROEX SODIUM 500 MG PO DR TAB
500.0000 mg | DELAYED_RELEASE_TABLET | Freq: Two times a day (BID) | ORAL | Status: DC
  Administered 2019-07-03 – 2019-07-04 (×2): 500 mg via ORAL
  Filled 2019-07-03 (×2): qty 1

## 2019-07-03 MED ORDER — TRAZODONE HCL 50 MG PO TABS
50.0000 mg | ORAL_TABLET | Freq: Every evening | ORAL | Status: DC | PRN
  Administered 2019-07-04: 50 mg via ORAL
  Filled 2019-07-03: qty 1

## 2019-07-03 MED ORDER — THIAMINE HCL 100 MG PO TABS
100.0000 mg | ORAL_TABLET | Freq: Every day | ORAL | Status: DC
  Administered 2019-07-03 – 2019-07-04 (×2): 100 mg via ORAL
  Filled 2019-07-03 (×2): qty 1

## 2019-07-03 MED ORDER — SERTRALINE HCL 50 MG PO TABS
50.0000 mg | ORAL_TABLET | Freq: Every day | ORAL | Status: DC
  Administered 2019-07-03 – 2019-07-04 (×2): 50 mg via ORAL
  Filled 2019-07-03 (×2): qty 1

## 2019-07-03 MED ORDER — IBUPROFEN 200 MG PO TABS: 600.0000 mg | ORAL_TABLET | Freq: Three times a day (TID) | ORAL | Status: DC | PRN

## 2019-07-03 MED ORDER — ONDANSETRON HCL 4 MG PO TABS: 4.0000 mg | ORAL_TABLET | Freq: Three times a day (TID) | ORAL | Status: DC | PRN

## 2019-07-03 MED ORDER — THIAMINE HCL 100 MG/ML IJ SOLN: 100.0000 mg | Freq: Every day | INTRAMUSCULAR | Status: DC

## 2019-07-03 MED ORDER — LORAZEPAM 2 MG/ML IJ SOLN: 0.0000 mg | Freq: Four times a day (QID) | INTRAMUSCULAR | Status: DC

## 2019-07-03 MED ORDER — LORAZEPAM 1 MG PO TABS: 0.0000 mg | ORAL_TABLET | Freq: Four times a day (QID) | ORAL | Status: DC

## 2019-07-03 MED ORDER — POTASSIUM CHLORIDE CRYS ER 20 MEQ PO TBCR
40.0000 meq | EXTENDED_RELEASE_TABLET | Freq: Once | ORAL | Status: AC
  Administered 2019-07-03: 40 meq via ORAL
  Filled 2019-07-03: qty 2

## 2019-07-03 MED ORDER — LORAZEPAM 1 MG PO TABS: 0.0000 mg | ORAL_TABLET | Freq: Two times a day (BID) | ORAL | Status: DC

## 2019-07-03 MED ORDER — LORAZEPAM 2 MG/ML IJ SOLN: 0.0000 mg | Freq: Two times a day (BID) | INTRAMUSCULAR | Status: DC

## 2019-07-03 MED ORDER — NICOTINE 7 MG/24HR TD PT24
7.0000 mg | MEDICATED_PATCH | Freq: Once | TRANSDERMAL | Status: AC
  Administered 2019-07-03: 7 mg via TRANSDERMAL
  Filled 2019-07-03: qty 1

## 2019-07-03 MED ORDER — HYDROXYZINE HCL 25 MG PO TABS: 50.0000 mg | ORAL_TABLET | Freq: Four times a day (QID) | ORAL | Status: DC | PRN

## 2019-07-03 MED ORDER — CLONIDINE HCL 0.1 MG PO TABS
0.1000 mg | ORAL_TABLET | Freq: Once | ORAL | Status: AC
  Administered 2019-07-03: 0.1 mg via ORAL
  Filled 2019-07-03: qty 1

## 2019-07-03 MED ORDER — AMLODIPINE BESYLATE 5 MG PO TABS
10.0000 mg | ORAL_TABLET | Freq: Every day | ORAL | Status: DC
  Administered 2019-07-03 – 2019-07-04 (×2): 10 mg via ORAL
  Filled 2019-07-03 (×2): qty 2

## 2019-07-03 NOTE — BHH Counselor (Signed)
Clinician attempted to call [facility] to speak to nurse however call ended, clinician messaged pt's nurse Matthew Founds, RN) is awaiting reply.   "Hi. Its Matthew Kaufman from TTS I'm ready to assess the pt in [place], if he's placed in a private room."  Please message or call.   Matthew Pulling, MS, [facility], [facility] Triage Specialist [phone].

## 2019-07-03 NOTE — BHH Counselor (Signed)
Clinician attempted to call pt's nurse however no answer.    Redmond Pulling, MS, [facility], [facility] Triage Specialist [phone]

## 2019-07-03 NOTE — BHH Counselor (Signed)
Clinician called [facility] however no answer, to try to talk to the pt's nurse.   Please call clinician when available.   Redmond Pulling, MS, [facility], [facility] Triage Specialist [phone].

## 2019-07-03 NOTE — ED Triage Notes (Signed)
Per EMS:  Patient reports ETOH use, cocaine use, and Marijuana use. Patient's BP was reportedly elevated. No SI reported or HI. Patient reportedly hallucinating shadows and demons.

## 2019-07-03 NOTE — BH Assessment (Addendum)
Tele Assessment Note   Patient Name: Matthew Kaufman MRN: [id number] Referring Physician: Emeterio Reeve, PA-C. Location of Patient: [facility], [facility]. Location of Provider: [facility]  Justus Droke is an 49 y.o. male, who presents voluntary and unaccompanied to [facility]. Clinician asked the pt, "what brought you to the hospital?"  Pt reported, he initially presented to [facility] but was transferred to [facility] because his blood pressure elevated. Pt reported, "having bad thoughts in my mind-hearing voices telling me this or that; seeing spirits of demons." Pt reported, a voice told him to spit in someone's face. Pt reported, in the past the voices told him to hurt himself and/or others but not currently. Pt reported, how often he hears and sees things depends on his mood. Pt reported in 2017 he attempted suicide by cutting his wrist. Pt reported, that was the last time he cut himself. Pt reported, he was suicidal within the past six months without a plan; pt denies current SI, HI, self-injurious behaviors and access to weapons.   Pt reported, drinking liquor, today (amount unknown). Pt reported, using a couple of grams of cocaine, today. Pt reported, smoking a couple of blunts, today. Pt's BAL was .37 at 1607. Pt's UDS was positive for cocaine and marijuana. Pt denies, being linked to OPT resources (medication management and/or counseling.) Per chart pt has two previous inpatient admissions at [facility] in 2018 and 2019.   Pt presents drowsy at times in scrubs with logical, coherent speech. Pt's eye contact was poor, pt had his head down mostly during the assessment. Pt's mood, affect was depressed. Pt's thought process was coherent, relevant. Pt's judgement was impaired. Pt was oriented x4. Pt's concentration, insight was fair. Pt's impulse control was poor. Pt reported, if discharged from [facility] he is unsure he can contract for safety because of is mental state he needs help and  wants long-term substance use and mental heath care. Pt reported, if inpatient treatment is recommended he will sign-in voluntarily.   *Pt reported, having family, friend supports however declined when clinician asked if she could call supports to gather collateral information.*   Diagnosis: Major Depressive Disorder, recurrent, severe with psychotic features.                     Cocaine use Disorder, severe.                     Alcohol use Disorder, severe.   Past Medical History:  Past Medical History:  Diagnosis Date  . Hypertension     Past Surgical History:  Procedure Laterality Date  . APPENDECTOMY      Family History: No family history on file.  Social History:  reports that he has been smoking cigarettes. He has been smoking about 0.50 packs per day. He has never used smokeless tobacco. He reports current alcohol use of about 6.0 standard drinks of alcohol per week. He reports current drug use. Drugs: Marijuana and Cocaine.  Additional Social History:  Alcohol / Drug Use Pain Medications: See MAR Prescriptions: See MAR Over the Counter: See MAR History of alcohol / drug use?: Yes Substance #1 Name of Substance 1: Cigarettes. 1 - Age of First Use: UTA 1 - Amount (size/oz): Pt reported, smoking a pack. 1 - Frequency: Daily. 1 - Duration: Ongoing. 1 - Last Use / Amount: 07/03/2019. Substance #2 Name of Substance 2: Alcohol. 2 - Age of First Use: UTA 2 - Amount (size/oz): Pt  reported, drinking liquor, today. (amount unknown) 2 - Frequency: Daily. 2 - Duration: Ongoing. 2 - Last Use / Amount: 07/03/2019. Substance #3 Name of Substance 3: Cocaine. 3 - Age of First Use: UTA 3 - Amount (size/oz): Pt reported, a couple of grams. 3 - Frequency: Daily. 3 - Duration: Ongoing. 3 - Last Use / Amount: 07/03/2019. Substance #4 Name of Substance 4: Marijuana. 4 - Age of First Use: UTA 4 - Amount (size/oz): Pt reported, a couple of blunts. 4 - Frequency: Daily. 4 -  Duration: Ongoing. 4 - Last Use / Amount: 07/03/2019.  CIWA: CIWA-Ar BP: (!) 148/100 Pulse Rate: 79 Nausea and Vomiting: no nausea and no vomiting Tactile Disturbances: none Tremor: no tremor Auditory Disturbances: not present Paroxysmal Sweats: barely perceptible sweating, palms moist Visual Disturbances: not present Anxiety: mildly anxious Headache, Fullness in Head: very mild Agitation: normal activity Orientation and Clouding of Sensorium: oriented and can do serial additions CIWA-Ar Total: 3 COWS:    Allergies:  Allergies  Allergen Reactions  . Amoxicillin Hives    Has patient had a PCN reaction causing immediate rash, facial/tongue/throat swelling, SOB or lightheadedness with hypotension: Yes Has patient had a PCN reaction causing severe rash involving mucus membranes or skin: yes Has patient had a PCN reaction that required hospitalization: No Has patient had a PCN reaction occurring within the last 10 years: No If all of the above answers are "NO", then may proceed with Cephalosporin use.   Marland Kitchen Doxycycline Hives    Home Medications: (Not in a hospital admission)   OB/GYN Status:  No LMP for male patient.  General Assessment Data Assessment unable to be completed: Yes Reason for not completing assessment: pt in hallway [facility] Assessment: In system Is this a Tele or Face-to-Face Assessment?: Tele Assessment Is this an Initial Assessment or a Re-assessment for this encounter?: Initial Assessment Patient Accompanied by:: N/A Language Other than English: No Living Arrangements: Other (Comment)("With somebody." ) What gender do you identify as?: Male Marital status: Single Living Arrangements: Non-relatives/Friends Can pt return to current living arrangement?: (Per pt, "I don't know." ) Admission Status: Voluntary Is patient capable of signing voluntary admission?: Yes Referral Source: Self/Family/Friend Insurance type: Self-pay.       Crisis Care Plan Living Arrangements: Non-relatives/Friends Legal Guardian: (Self. ) Name of Psychiatrist: NA Name of Therapist: NA  Education Status Is patient currently in school?: No Is the patient employed, unemployed or receiving disability?: Unemployed  Risk to self with the past 6 months Suicidal Ideation: No-Not Currently/Within Last 6 Months Has patient been a risk to self within the past 6 months prior to admission? : No(Pt denies. ) Suicidal Intent: No Has patient had any suicidal intent within the past 6 months prior to admission? : No Is patient at risk for suicide?: No Suicidal Plan?: No Has patient had any suicidal plan within the past 6 months prior to admission? : No Access to Means: No What has been your use of drugs/alcohol within the last 12 months?: Cigarettes, cocaine, marijuana, alcohol.  Previous Attempts/Gestures: Yes How many times?: 1 Other Self Harm Risks: Substance use, AVH. Triggers for Past Attempts: Unknown Intentional Self Injurious Behavior: Cutting Comment - Self Injurious Behavior: Pt reported, he cut his wrist in 2017 as a suicide attempt.  Recent stressful life event(s): Other (Comment)(Pt denies, stressors. ) Persecutory voices/beliefs?: No Depression: Yes Depression Symptoms: Feeling angry/irritable, Feeling worthless/self pity, Guilt, Fatigue, Isolating, Tearfulness, Despondent Substance abuse history and/or treatment for substance  abuse?: Yes Suicide prevention information given to non-admitted patients: Not applicable  Risk to Others within the past 6 months Homicidal Ideation: No(Pt denies. ) Does patient have any lifetime risk of violence toward others beyond the six months prior to admission? : No Thoughts of Harm to Others: No Current Homicidal Intent: No Current Homicidal Plan: No Access to Homicidal Means: No Identified Victim: NA History of harm to others?: No Assessment of Violence: None Noted Violent Behavior  Description: NA Does patient have access to weapons?: No Criminal Charges Pending?: No Does patient have a court date: No Is patient on probation?: No  Psychosis Hallucinations: Auditory, Visual Delusions: Unspecified  Mental Status Report Appearance/Hygiene: In scrubs Eye Contact: Poor Motor Activity: Unremarkable Speech: Logical/coherent Level of Consciousness: Drowsy Anxiety Level: Panic Attacks Panic attack frequency: Pt reported, he had a panic attack 3-4 months ago.  Most recent panic attack: 3-4 months ago.  Thought Processes: Coherent, Relevant Judgement: Impaired Orientation: Person, Place, Time, Situation Obsessive Compulsive Thoughts/Behaviors: None  Cognitive Functioning Concentration: Fair Is patient IDD: No Insight: Fair Impulse Control: Poor Appetite: Fair Sleep: Decreased Total Hours of Sleep: 5 Vegetative Symptoms: Unable to Assess  ADLScreening [facility]) Patient's cognitive ability adequate to safely complete daily activities?: Yes Patient able to express need for assistance with ADLs?: Yes Independently performs ADLs?: Yes (appropriate for developmental age)  Prior Inpatient Therapy Prior Inpatient Therapy: Yes Prior Therapy Dates: 2017 Prior Therapy Facilty/Provider(s): [facility] Reason for Treatment: Suicide attempt.  Prior Outpatient Therapy Prior Outpatient Therapy: No Does patient have an ACCT team?: No Does patient have Intensive In-House Services?  : No Does patient have [facility] services? : No Does patient have P4CC services?: No  ADL Screening (condition at time of admission) Patient's cognitive ability adequate to safely complete daily activities?: Yes Is the patient deaf or have difficulty hearing?: No Does the patient have difficulty seeing, even when wearing glasses/contacts?: No Does the patient have difficulty concentrating, remembering, or making decisions?: Yes Patient able to express need for assistance with  ADLs?: Yes Does the patient have difficulty dressing or bathing?: No Independently performs ADLs?: Yes (appropriate for developmental age) Does the patient have difficulty walking or climbing stairs?: No Weakness of Legs: None(Pt reported, his right should hurts.) Weakness of Arms/Hands: None  Home Assistive Devices/Equipment Home Assistive Devices/Equipment: (Reading glasses.)    Abuse/Neglect Assessment (Assessment to be complete while patient is alone) Abuse/Neglect Assessment Can Be Completed: Yes Physical Abuse: Denies Verbal Abuse: Yes, past (Comment) Sexual Abuse: Denies Exploitation of patient/patient's resources: Denies Self-Neglect: Denies     Merchant navy officer (For Healthcare) Does Patient Have a Medical Advance Directive?: No Would patient like information on creating a medical advance directive?: No - Patient declined          Disposition: Nira Conn, NP recommends pt to be reassessed by psychiatry tomorrow. Disposition discussed with Dorene Grebe, RN. RN to discuss with EDP.    Disposition Initial Assessment Completed for this Encounter: Yes  This service was provided via telemedicine using a 2-way, interactive audio and video technology.  Names of all persons participating in this telemedicine service and their role in this encounter. Name:  Wilmot Quevedo. Role: Patient.   Name: Redmond Pulling, MS, [facility], [facility]. Role: Counselor.           Redmond Pulling 07/03/2019 11:41 PM     Redmond Pulling, MS, [facility], [facility] Triage Specialist [phone]

## 2019-07-03 NOTE — ED Provider Notes (Signed)
[facility] DEPT Provider Note   CSN: [id number] Arrival date & time: 07/03/19  1558     History Chief Complaint  Patient presents with  . Medical Clearance    Matthew Kaufman is a 49 y.o. male with past medical history significant for bipolar disorder, polysubstance abuse, alcohol use disorder, hypertension presents to emergency department today via EMS with chief complaint of medical clearance.  Patient is endorsing auditory and visual hallucinations.  He states there are demons telling him what to do and he is fearful they will kill him.  Patient states that he is paranoid.  He admits to drinking 2/5 of alcohol today and snorting cocaine prior to arrival.  [facility] however was found to be hypertensive and sent to the emergency department for medical clearance.  He denies any physical complaints.    Past Medical History:  Diagnosis Date  . Hypertension     Patient Active Problem List   Diagnosis Date Noted  . Bipolar II disorder, severe, depressed, with anxious distress ([place]) 01/04/2018  . PTSD (post-traumatic stress disorder) 01/04/2018  . Polysubstance dependence including opioid type drug, episodic abuse ([place]) 01/04/2018  . Substance induced mood disorder ([place]) 12/23/2016  . Alcohol use disorder, severe, dependence ([place]) 12/23/2016  . HTN (hypertension) 05/08/2016  . Depression with anxiety 05/08/2016  . Insomnia 05/08/2016  . Substance abuse ([place]) 05/08/2016  . Cracked tooth 05/08/2016  . Acne 05/08/2016    Past Surgical History:  Procedure Laterality Date  . APPENDECTOMY         No family history on file.  Social History   Tobacco Use  . Smoking status: Current Every Day Smoker    Packs/day: 0.50    Types: Cigarettes  . Smokeless tobacco: Never Used  Substance Use Topics  . Alcohol use: Yes    Alcohol/week: 6.0 standard drinks    Types: 6 Glasses of wine per week  . Drug use: Yes     Types: Marijuana, Cocaine    Home Medications Prior to Admission medications   Medication Sig Start Date End Date Taking? Authorizing Provider  amLODipine (NORVASC) 10 MG tablet Take 1 tablet (10 mg total) by mouth daily. For high blood pressure Patient not taking: Reported on 07/03/2019 01/12/18   Lindell Spar I, NP  divalproex (DEPAKOTE) 500 MG DR tablet Take 1 tablet (500 mg total) by mouth every 12 (twelve) hours. Patient not taking: Reported on 07/03/2019 01/11/18   Derrill Center, NP  hydrOXYzine (ATARAX/VISTARIL) 50 MG tablet Take 1 tablet (50 mg total) by mouth every 6 (six) hours as needed for anxiety. Patient not taking: Reported on 07/03/2019 01/11/18   Derrill Center, NP  nicotine polacrilex (NICORETTE) 2 MG gum Take 1 each (2 mg total) by mouth as needed for smoking cessation. Patient not taking: Reported on 07/03/2019 01/11/18   Derrill Center, NP  sertraline (ZOLOFT) 50 MG tablet Take 1 tablet (50 mg total) by mouth daily. For depression Patient not taking: Reported on 07/03/2019 01/11/18   Derrill Center, NP  traZODone (DESYREL) 50 MG tablet Take 1 tablet (50 mg total) by mouth at bedtime as needed for sleep. Patient not taking: Reported on 07/03/2019 01/11/18   Derrill Center, NP    Allergies    Amoxicillin and Doxycycline  Review of Systems   Review of Systems  All other systems are reviewed and are negative for acute change except as noted in the HPI.  Physical Exam Updated Vital Signs BP (!) 175/114 (BP Location: Left Arm)   Pulse 98   Temp 98.6 F (37 C)   Resp 16   Ht 5\' 9"  (1.753 m)   Wt 95.3 kg   SpO2 98%   BMI 31.01 kg/m   Physical Exam Vitals and nursing note reviewed.  Constitutional:      General: He is not in acute distress.    Appearance: He is not ill-appearing.  HENT:     Head: Normocephalic and atraumatic.     Right Ear: Tympanic membrane and external ear normal.     Left Ear: Tympanic membrane and external ear normal.     Nose:  Nose normal.     Mouth/Throat:     Mouth: Mucous membranes are moist.     Pharynx: Oropharynx is clear.  Eyes:     General: No scleral icterus.       Right eye: No discharge.        Left eye: No discharge.     Extraocular Movements: Extraocular movements intact.     Conjunctiva/sclera: Conjunctivae normal.     Pupils: Pupils are equal, round, and reactive to light.  Neck:     Vascular: No JVD.  Cardiovascular:     Rate and Rhythm: Normal rate and regular rhythm.     Pulses: Normal pulses.          Radial pulses are 2+ on the right side and 2+ on the left side.     Heart sounds: Normal heart sounds.  Pulmonary:     Comments: Lungs clear to auscultation in all fields. Symmetric chest rise. No wheezing, rales, or rhonchi. Abdominal:     Comments: Abdomen is soft, non-distended, and non-tender in all quadrants. No rigidity, no guarding. No peritoneal signs.  Musculoskeletal:        General: Normal range of motion.     Cervical back: Normal range of motion.  Skin:    General: Skin is warm and dry.     Capillary Refill: Capillary refill takes less than 2 seconds.  Neurological:     Mental Status: He is oriented to person, place, and time.     GCS: GCS eye subscore is 4. GCS verbal subscore is 5. GCS motor subscore is 6.     Comments: Fluent speech, no facial droop.  Psychiatric:        Attention and Perception: He perceives auditory and visual hallucinations.        Behavior: Behavior is withdrawn.        Thought Content: Thought content is paranoid. Thought content does not include homicidal or suicidal ideation. Thought content does not include homicidal or suicidal plan.       ED Results / Procedures / Treatments   Labs (all labs ordered are listed, but only abnormal results are displayed) Labs Reviewed  COMPREHENSIVE METABOLIC PANEL - Abnormal; Notable for the following components:      Result Value   Potassium 3.2 (*)    Glucose, Bld 108 (*)    Albumin 5.1 (*)    AST  55 (*)    All other components within normal limits  ETHANOL - Abnormal; Notable for the following components:   Alcohol, Ethyl (B) 37 (*)    All other components within normal limits  CBC - Abnormal; Notable for the following components:   WBC 11.6 (*)    All other components within normal limits  RAPID URINE DRUG SCREEN, HOSP PERFORMED - Abnormal;  Notable for the following components:   Cocaine POSITIVE (*)    Tetrahydrocannabinol POSITIVE (*)    All other components within normal limits  SALICYLATE LEVEL - Abnormal; Notable for the following components:   Salicylate Lvl <7.0 (*)    All other components within normal limits  ACETAMINOPHEN LEVEL - Abnormal; Notable for the following components:   Acetaminophen (Tylenol), Serum <10 (*)    All other components within normal limits  SARS CORONAVIRUS 2 BY RT PCR (HOSPITAL ORDER, PERFORMED IN  HOSPITAL LAB)    EKG None  Radiology No results found.  Procedures Procedures (including critical care time)  Medications Ordered in ED Medications  potassium chloride SA (KLOR-CON) CR tablet 40 mEq (has no administration in time range)  LORazepam (ATIVAN) injection 0-4 mg (has no administration in time range)    Or  LORazepam (ATIVAN) tablet 0-4 mg (has no administration in time range)  LORazepam (ATIVAN) injection 0-4 mg (has no administration in time range)    Or  LORazepam (ATIVAN) tablet 0-4 mg (has no administration in time range)  thiamine tablet 100 mg (has no administration in time range)    Or  thiamine (B-1) injection 100 mg (has no administration in time range)  ondansetron (ZOFRAN) tablet 4 mg (has no administration in time range)  ibuprofen (ADVIL) tablet 600 mg (has no administration in time range)  hydrOXYzine (ATARAX/VISTARIL) tablet 50 mg (has no administration in time range)  sertraline (ZOLOFT) tablet 50 mg (has no administration in time range)  traZODone (DESYREL) tablet 50 mg (has no administration in  time range)  divalproex (DEPAKOTE) DR tablet 500 mg (has no administration in time range)  amLODipine (NORVASC) tablet 10 mg (has no administration in time range)  nicotine (NICODERM CQ - dosed in mg/24 hr) patch 7 mg (has no administration in time range)  cloNIDine (CATAPRES) tablet 0.1 mg (0.1 mg Oral Given 07/03/19 1811)    ED Course  I have reviewed the triage vital signs and the nursing notes.  Pertinent labs & imaging results that were available during my care of the patient were reviewed by me and considered in my medical decision making (see chart for details).    MDM Rules/Calculators/A&P                      No medical complaints today.  Labs ordered and I personally reviewed results.  He has nonspecific leukocytosis of 11.6, no anemia.  Does have mild hypokalemia, will replete with p.o.  AST is slightly elevated at 55.  UDS is positive for cocaine and tetrahydrocannabinol. Ordered clonidine for hypertension.  No signs of endorgan damage on lab work today.  Ethanol level is 37.  Patient ambulates with steady gait.  He has clear speech and appears to have goal oriented thinking.  He is clinically sober on my exam. Pt is medically cleared for TTS evaluation, disposition per [facility].   Home medications and regular diet ordered.   Will sign out to default provider as he waits for TTS evaluation.   Portions of this note were generated with Scientist, clinical (histocompatibility and immunogenetics). Dictation errors may occur despite best attempts at proofreading.   Final Clinical Impression(s) / ED Diagnoses Final diagnoses:  Hallucinations  Substance abuse [facility])    Rx / DC Orders ED Discharge Orders    None       Kathyrn Lass 07/03/19 1840    Lorre Nick, MD 07/05/19 1551

## 2019-07-04 ENCOUNTER — Encounter (HOSPITAL_COMMUNITY): Payer: Self-pay | Admitting: Registered Nurse

## 2019-07-04 NOTE — Discharge Instructions (Addendum)
To help you maintain a sober lifestyle, a substance abuse treatment program may be beneficial to you.  Contact one of the following facilities at your earliest opportunity to ask about enrolling:  RESIDENTIAL PROGRAMS:       [facility]      [address]      [place], [place] [address]      [phone]       [facility]      [address]      [place], [place] [address]      [phone]       [facility]      [address]      [place], [place] [address]      [phone]  OUTPATIENT PROGRAMS:       [facility]      [address]      [place], [place] [address]      [phone]       New patients are seen at their walk-in clinic.  Walk-in hours are Monday - Friday from 8:30 am - 12:00 pm, and from 1:00 pm - 2:30 pm.  Walk-in patients are seen on a first come, first served basis, so try to arrive as early as possible for the best chance of being seen the same day. 

## 2019-07-04 NOTE — ED Notes (Signed)
Discharge paperwork reviewed with pt, in addition pt was provided with information for [facility], per his request.  Pt ambulatory with Peer Support to ED entrance to meet with ride.

## 2019-07-04 NOTE — BH Assessment (Signed)
[facility] Assessment Progress Note  Per Archana Kumar, MD, this pt does not require psychiatric hospitalization at this time.  Pt is to be discharged from [facility] with referral information for area substance abuse treatment providers. This has been included in pt's discharge instructions.  Pt would also benefit from seeing Peer Support Specialists, and a peer support consult has been ordered for pt.  Pt's nurse has been notified.  Thomas Hughes, MA Triage Specialist [phone]     

## 2019-07-04 NOTE — Patient Outreach (Signed)
CPSS contacted Transportation and was able to assist Pt to [facility] in [place].

## 2019-07-04 NOTE — ED Notes (Signed)
Pt sleeping, equal chest rise.  Will continue to monitor.

## 2019-07-04 NOTE — [facility] Suicide Risk Assessment (Cosign Needed)
Suicide Risk Assessment  Discharge Assessment   [facility] Discharge Suicide Risk Assessment   Principal Problem: Substance induced mood disorder ([place]) Discharge Diagnoses: Principal Problem:   Substance induced mood disorder ([place]) Active Problems:   PTSD (post-traumatic stress disorder)   Polysubstance dependence including opioid type drug, episodic abuse ([place])   Total Time spent with patient: 30 minutes  Musculoskeletal: Strength & Muscle Tone: within normal limits Gait & Station: normal Patient leans: N/A  Psychiatric Specialty Exam:   Blood pressure (!) 126/94, pulse 61, temperature 98.6 F (37 C), resp. rate 18, height 5\' 9"  (1.753 m), weight 95.3 kg, SpO2 100 %.Body mass index is 31.01 kg/m.  General Appearance: Casual  Eye Contact::  Good  Speech:  Clear and Coherent and Normal Rate409  Volume:  Normal  Mood:  Anxious and Depressed  Affect:  Congruent  Thought Process:  Coherent, Goal Directed and Descriptions of Associations: Intact  Orientation:  Full (Time, Place, and Person)  Thought Content:  Logical  Suicidal Thoughts:  No  Homicidal Thoughts:  No  Memory:  Immediate;   Good Recent;   Good  Judgement:  Intact  Insight:  Present  Psychomotor Activity:  Normal  Concentration:  Good  Recall:  Good  Fund of Knowledge:Good  Language: Good  Akathisia:  No  Handed:  Right  AIMS (if indicated):     Assets:  Communication Skills Desire for Improvement Social Support  Sleep:     Cognition: WNL  ADL's:  Intact   Mental Status Per Nursing Assessment::   On Admission:    Matthew Kaufman, 49 y.o., male patient seen via tele psych by this provider, Dr. Dwyane Dee; and chart reviewed on 07/04/19.  On evaluation Matthew Kaufman reports that he has a problem with alcohol and cocaine and wants to get into a long term substance use program or rehab to help get his life together.  Patient states that he was living with a friend bur unsure if he can go back and was working up to  last week.  States that he came to the hospital seeking help.    During evaluation Matthew Kaufman is alert/oriented x 4; calm/cooperative; and mood is congruent with affect.  He does not appear to be responding to internal/external stimuli or delusional thoughts.  Patient denies suicidal/self-harm/homicidal ideation, psychosis, and paranoia.  Patient answered question appropriately.     Demographic Factors:  Male  Loss Factors: homelessness  Historical Factors: Impulsivity  Risk Reduction Factors:   Religious beliefs about death  Continued Clinical Symptoms:  Alcohol/Substance Abuse/Dependencies Previous Psychiatric Diagnoses and Treatments  Cognitive Features That Contribute To Risk:  None    Suicide Risk:  Minimal: No identifiable suicidal ideation.  Patients presenting with no risk factors but with morbid ruminations; may be classified as minimal risk based on the severity of the depressive symptoms  Plan Of Care/Follow-up recommendations:  Activity:  As tolerated Diet:  Heart healthy     Discharge Instructions     To help you maintain a sober lifestyle, a substance abuse treatment program may be beneficial to you.  Contact one of the following facilities at your earliest opportunity to ask about enrolling:  RESIDENTIAL PROGRAMS:       [place], [place] [address]      [phone]       [facility]      [address] [place]      [place], [place] [address]      [phone]       [facility]      [address]      [place], [place] [address]      [phone]  OUTPATIENT PROGRAMS:       [facility]      [address]      [place], [place] [address]      [phone]      New patients are seen at their walk-in clinic.  Walk-in hours are Monday - Friday from 8:30 am - 12:00 pm, and from 1:00 pm - 2:30 pm.  Walk-in patients are seen on a first come, first served basis, so try to  arrive as early as possible for the best chance of being seen the same day.    Disposition:  Psychiatrically cleared No evidence of imminent risk to self or others at present.   Patient does not meet criteria for psychiatric inpatient admission. Supportive therapy provided about ongoing stressors. Refer to IOP. Discussed crisis plan, support from social network, calling 911, coming to the Emergency Department, and calling Suicide Hotline.  Elda Dunkerson, NP 07/04/2019, 3:10 PM

## 2019-07-04 NOTE — Patient Outreach (Signed)
ED Peer Support Specialist Patient Intake (Complete at intake & 30-60 Day Follow-up)  Name: Matthew Kaufman  MRN: [id number]  Age: 49 y.o.   Date of Admission: 07/04/2019  Intake: Initial Comments:      Primary Reason Admitted: Hallucinations Substance abuse    Lab values: Alcohol/ETOH: Positive Positive UDS? No Amphetamines: No Barbiturates: No Benzodiazepines: No Cocaine: Yes Opiates: No Cannabinoids: Yes  Demographic information: Gender: Male Ethnicity: African American Marital Status: Single Insurance Status:   Ecologist (Work Neurosurgeon, Physicist, medical, etc.: No Lives with: Alone Living situation: Homeless  Reported Patient History: Patient reported health conditions: Depression Patient aware of HIV and hepatitis status: No  In past year, has patient visited ED for any reason? No  Number of ED visits:    Reason(s) for visit:    In past year, has patient been hospitalized for any reason? No  Number of hospitalizations:    Reason(s) for hospitalization:    In past year, has patient been arrested? No  Number of arrests:    Reason(s) for arrest:    In past year, has patient been incarcerated? No  Number of incarcerations:    Reason(s) for incarceration:    In past year, has patient received medication-assisted treatment? No  In past year, patient received the following treatments:    In past year, has patient received any harm reduction services? No  Did this include any of the following?    In past year, has patient received care from a mental health provider for diagnosis other than SUD? No  In past year, is this first time patient has overdosed? No  Number of past overdoses:    In past year, is this first time patient has been hospitalized for an overdose? No  Number of hospitalizations for overdose(s):    Is patient currently receiving treatment for a mental health diagnosis? No  Patient reports experiencing  difficulty participating in SUD treatment: No    Most important reason(s) for this difficulty?    Has patient received prior services for treatment?    In past, patient has received services from following agencies:    Plan of Care:  Suggested follow up at these agencies/treatment centers: Other (comment)  Other information: CPSS met with Pt an was able to process with Pt to gain more information to better assist Pt. CPSS discussed the importance of Pt doing what he needed to do to better the quality of his life. CPSS discussed the importance of Pt making sure he is at a facility that he can gain from. Pt stated that he is tired ant hat he is willing to better the quality of his life. CPSS spoke with Pt about going to a few different facilities which CPSS addressed the fact that Pt main gain more out of visiting [place] [facility] [place], [place]  07/04/2019 3:08 PM

## 2019-07-13 ENCOUNTER — Other Ambulatory Visit: Payer: Self-pay

## 2019-07-13 ENCOUNTER — Emergency Department (HOSPITAL_COMMUNITY)
Admission: EM | Admit: 2019-07-13 | Discharge: 2019-07-13 | Disposition: A | Discharge status: Home / Self Care | Payer: Self-pay | Attending: Emergency Medicine | Admitting: Emergency Medicine

## 2019-07-13 ENCOUNTER — Encounter (HOSPITAL_COMMUNITY): Payer: Self-pay | Admitting: Emergency Medicine

## 2019-07-13 ENCOUNTER — Emergency Department (HOSPITAL_COMMUNITY): Payer: Self-pay

## 2019-07-13 DIAGNOSIS — S61211A Laceration without foreign body of left index finger without damage to nail, initial encounter: Secondary | ICD-10-CM | POA: Insufficient documentation

## 2019-07-13 DIAGNOSIS — Y929 Unspecified place or not applicable: Secondary | ICD-10-CM | POA: Insufficient documentation

## 2019-07-13 DIAGNOSIS — T754XXA Electrocution, initial encounter: Secondary | ICD-10-CM | POA: Insufficient documentation

## 2019-07-13 DIAGNOSIS — I10 Essential (primary) hypertension: Secondary | ICD-10-CM | POA: Insufficient documentation

## 2019-07-13 DIAGNOSIS — F17210 Nicotine dependence, cigarettes, uncomplicated: Secondary | ICD-10-CM | POA: Insufficient documentation

## 2019-07-13 DIAGNOSIS — W868XXA Exposure to other electric current, initial encounter: Secondary | ICD-10-CM | POA: Insufficient documentation

## 2019-07-13 DIAGNOSIS — M25511 Pain in right shoulder: Secondary | ICD-10-CM | POA: Insufficient documentation

## 2019-07-13 DIAGNOSIS — Y999 Unspecified external cause status: Secondary | ICD-10-CM | POA: Insufficient documentation

## 2019-07-13 DIAGNOSIS — Y939 Activity, unspecified: Secondary | ICD-10-CM | POA: Insufficient documentation

## 2019-07-13 DIAGNOSIS — Z79899 Other long term (current) drug therapy: Secondary | ICD-10-CM | POA: Insufficient documentation

## 2019-07-13 MED ORDER — TETANUS-DIPHTH-ACELL PERTUSSIS 5-2.5-18.5 LF-MCG/0.5 IM SUSP
0.5000 mL | Freq: Once | INTRAMUSCULAR | Status: DC
  Filled 2019-07-13: qty 0.5

## 2019-07-13 MED ORDER — TETANUS-DIPHTH-ACELL PERTUSSIS 5-2.5-18.5 LF-MCG/0.5 IM SUSP
INTRAMUSCULAR | Status: AC
  Filled 2019-07-13: qty 0.5

## 2019-07-13 MED ORDER — ACETAMINOPHEN 500 MG PO TABS
1000.0000 mg | ORAL_TABLET | Freq: Once | ORAL | Status: AC
  Administered 2019-07-13: 1000 mg via ORAL
  Filled 2019-07-13 (×2): qty 2
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
## 2019-07-13 NOTE — ED Notes (Signed)
Patient transported to x-ray. ?

## 2019-07-13 NOTE — ED Notes (Signed)
Patient given tetanus IM at left deltoid.
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
## 2019-07-15 ENCOUNTER — Encounter (HOSPITAL_COMMUNITY): Payer: Self-pay | Admitting: Emergency Medicine

## 2019-07-15 ENCOUNTER — Emergency Department (HOSPITAL_COMMUNITY)
Admission: EM | Admit: 2019-07-15 | Discharge: 2019-07-15 | Payer: Self-pay | Attending: Emergency Medicine | Admitting: Emergency Medicine

## 2019-07-15 ENCOUNTER — Other Ambulatory Visit: Payer: Self-pay

## 2019-07-15 DIAGNOSIS — F1410 Cocaine abuse, uncomplicated: Secondary | ICD-10-CM | POA: Insufficient documentation

## 2019-07-15 DIAGNOSIS — T8130XA Disruption of wound, unspecified, initial encounter: Secondary | ICD-10-CM | POA: Insufficient documentation

## 2019-07-15 DIAGNOSIS — I10 Essential (primary) hypertension: Secondary | ICD-10-CM | POA: Insufficient documentation

## 2019-07-15 DIAGNOSIS — F17210 Nicotine dependence, cigarettes, uncomplicated: Secondary | ICD-10-CM | POA: Insufficient documentation

## 2019-07-15 DIAGNOSIS — X58XXXD Exposure to other specified factors, subsequent encounter: Secondary | ICD-10-CM | POA: Insufficient documentation

## 2019-07-15 DIAGNOSIS — S61211D Laceration without foreign body of left index finger without damage to nail, subsequent encounter: Secondary | ICD-10-CM | POA: Insufficient documentation

## 2019-07-15 DIAGNOSIS — F1210 Cannabis abuse, uncomplicated: Secondary | ICD-10-CM | POA: Insufficient documentation

## 2019-07-15 MED ORDER — CEPHALEXIN 500 MG PO CAPS: 500.0000 mg | ORAL_CAPSULE | Freq: Four times a day (QID) | ORAL | 0 refills | Status: DC

## 2019-07-15 MED ORDER — CEPHALEXIN 500 MG PO CAPS
500.0000 mg | ORAL_CAPSULE | Freq: Four times a day (QID) | ORAL | 0 refills | Status: AC
Start: 2019-07-15 — End: ?

## 2019-07-15 MED ORDER — CLINDAMYCIN HCL 300 MG PO CAPS: 300.0000 mg | ORAL_CAPSULE | Freq: Once | ORAL | Status: DC

## 2019-07-15 MED ORDER — CEPHALEXIN 500 MG PO CAPS
1000.0000 mg | ORAL_CAPSULE | Freq: Once | ORAL | Status: AC
  Administered 2019-07-15: 1000 mg via ORAL
  Filled 2019-07-15: qty 2

## 2019-07-15 MED ORDER — ACETAMINOPHEN 325 MG PO TABS
650.0000 mg | ORAL_TABLET | Freq: Once | ORAL | Status: AC
  Administered 2019-07-15: 650 mg via ORAL
  Filled 2019-07-15: qty 2

## 2019-07-15 MED FILL — CEPHALEXIN 500 MG CAPSULE: 500 | 5 days supply | Qty: 20 | Fill #0
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
## 2019-07-15 NOTE — ED Provider Notes (Signed)
[facility]-EMERGENCY DEPT Provider Note: Lowella Dell, MD, FACEP  CSN: [id number] MRN: [id number] ARRIVAL: 07/15/19 at 0529 ROOM: WTR7/WTR7   CHIEF COMPLAINT  Wound Check   HISTORY OF PRESENT ILLNESS  07/15/19 5:40 AM Matthew Kaufman is a 49 y.o. male in police custody who was seen 07/13/2019 at [facility] for a laceration to his left index finger.  The wound was cleaned and explored and closed with tissue adhesive.  The wound has subsequently split back open.  There is moderate associated pain, worse with movement or palpation.  There is no purulent drainage or bleeding.   Past Medical History:  Diagnosis Date  . Hypertension     Past Surgical History:  Procedure Laterality Date  . APPENDECTOMY      History reviewed. No pertinent family history.  Social History   Tobacco Use  . Smoking status: Current Every Day Smoker    Packs/day: 0.50    Types: Cigarettes  . Smokeless tobacco: Never Used  Substance Use Topics  . Alcohol use: Yes    Alcohol/week: 6.0 standard drinks    Types: 6 Glasses of wine per week  . Drug use: Yes    Types: Marijuana, Cocaine    Prior to Admission medications   Medication Sig Start Date End Date Taking? Authorizing Provider  cephALEXin (KEFLEX) 500 MG capsule Take 1 capsule (500 mg total) by mouth 4 (four) times daily. 07/15/19   Clester Chlebowski, MD  amLODipine (NORVASC) 10 MG tablet Take 1 tablet (10 mg total) by mouth daily. For high blood pressure Patient not taking: Reported on 07/03/2019 01/12/18 07/15/19  Armandina Stammer I, NP  divalproex (DEPAKOTE) 500 MG DR tablet Take 1 tablet (500 mg total) by mouth every 12 (twelve) hours. Patient not taking: Reported on 07/03/2019 01/11/18 07/15/19  Oneta Rack, NP  sertraline (ZOLOFT) 50 MG tablet Take 1 tablet (50 mg total) by mouth daily. For depression Patient not taking: Reported on 07/03/2019 01/11/18 07/15/19  Oneta Rack, NP  traZODone (DESYREL) 50 MG tablet Take 1 tablet (50 mg total) by  mouth at bedtime as needed for sleep. Patient not taking: Reported on 07/03/2019 01/11/18 07/15/19  Oneta Rack, NP    Allergies Amoxicillin and Doxycycline   REVIEW OF SYSTEMS  Negative except as noted here or in the History of Present Illness.   PHYSICAL EXAMINATION  Initial Vital Signs Blood pressure (!) 154/111, pulse 85, temperature 98 F (36.7 C), temperature source Oral, resp. rate 20, SpO2 96 %.  Examination General: Well-developed, well-nourished male in no acute distress; appearance consistent with age of record HENT: normocephalic; atraumatic Eyes: Normal appearance Neck: supple Heart: regular rate and rhythm Lungs: clear to auscultation bilaterally Abdomen: soft; nondistended; nontender; bowel sounds present Extremities: No deformity; full range of motion Neurologic: Awake, alert and oriented; motor function intact in all extremities and symmetric; no facial droop Skin: Warm and dry; laceration of left index finger with dehiscence of wound status post adhesive closure, maceration of surrounding skin without erythema, purulent drainage or active bleeding   RESULTS  Summary of this visit's results, reviewed and interpreted by myself:   EKG Interpretation  Date/Time:    Ventricular Rate:    PR Interval:    QRS Duration:   QT Interval:    QTC Calculation:   R Axis:     Text Interpretation:        Laboratory Studies: No results found for this or any previous visit (from the past 24 hour(s)). Imaging  Studies: No results found.  ED COURSE and MDM  Nursing notes, initial and subsequent vitals signs, including pulse oximetry, reviewed and interpreted by myself.  Vitals:   07/15/19 0548  BP: (!) 154/111  Pulse: 85  Resp: 20  Temp: 98 F (36.7 C)  TempSrc: Oral  SpO2: 96%   Medications  cephALEXin (KEFLEX) capsule 1,000 mg (has no administration in time range)    Wound cleaned and reclosed with Steri-Strips and a sterile dressing applied.  Will  start on Keflex for possible infection.  Patient states he is not allergic to penicillins despite this notation in the medical record.  He states that his doxycycline he is allergic to.  PROCEDURES  Procedures   ED DIAGNOSES     ICD-10-CM   1. Wound dehiscence  T81.30XA        Wayland Baik, Jenny Reichmann, MD 07/15/19 [phone]

## 2022-01-26 IMAGING — CR DG SHOULDER 2+V*R*
3 series · 3 of 3 positions shown · non-contrast
Comparison: None.

CLINICAL DATA: Taste. Right shoulder numbness.

EXAM:
RIGHT SHOULDER - 2+ VIEW

[shoulder y view]
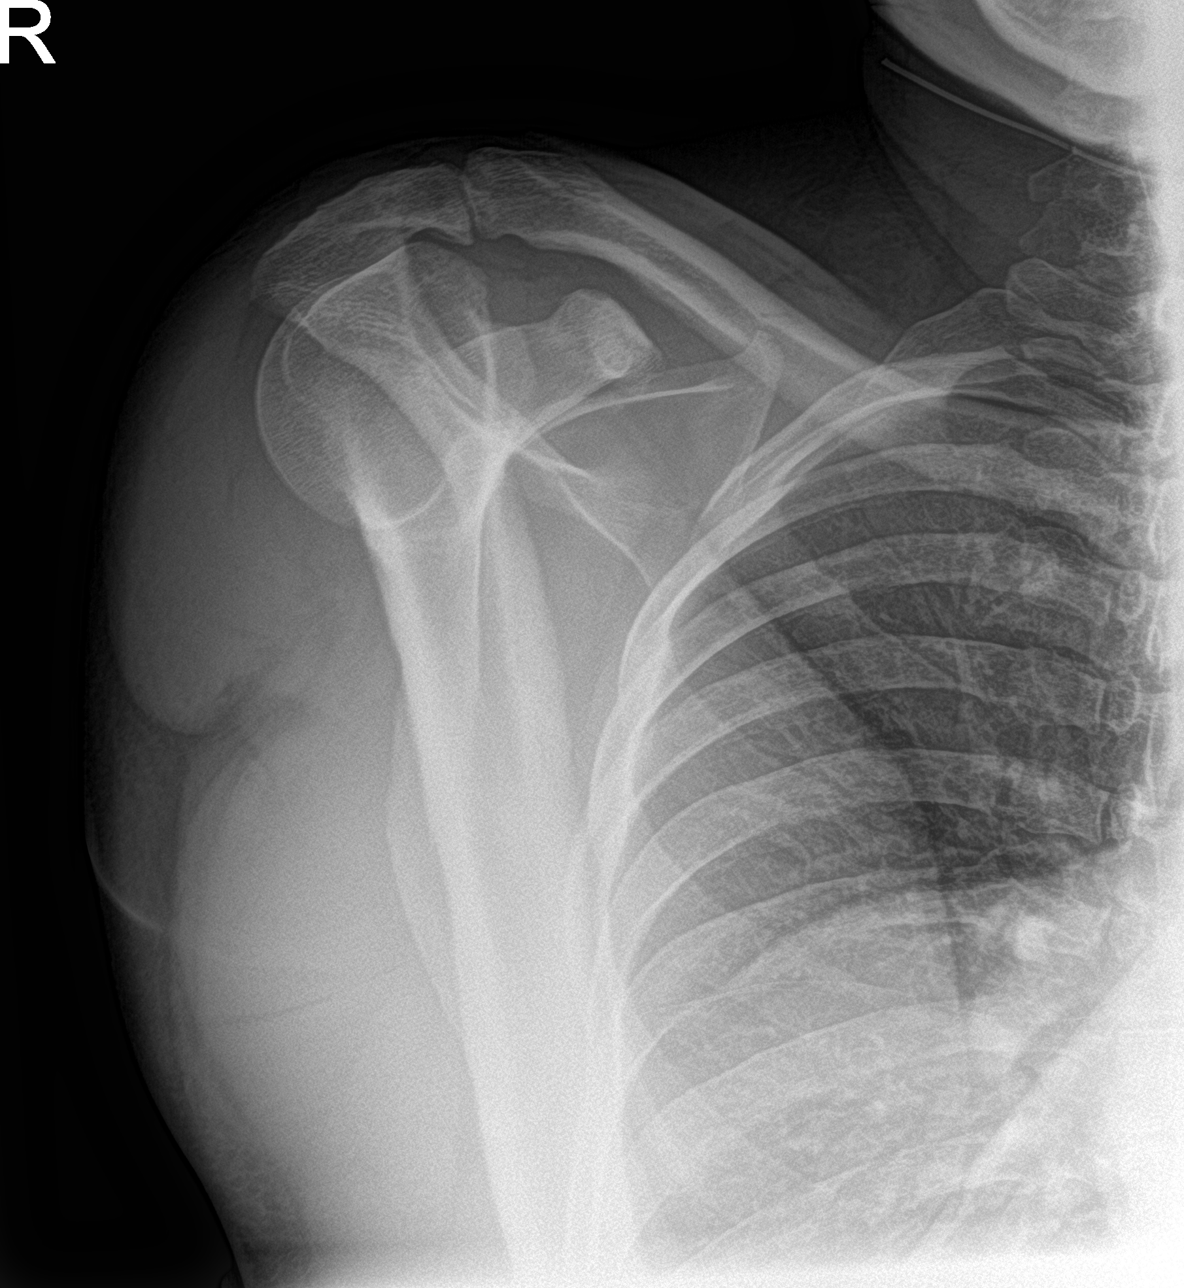

[shoulder axillary]
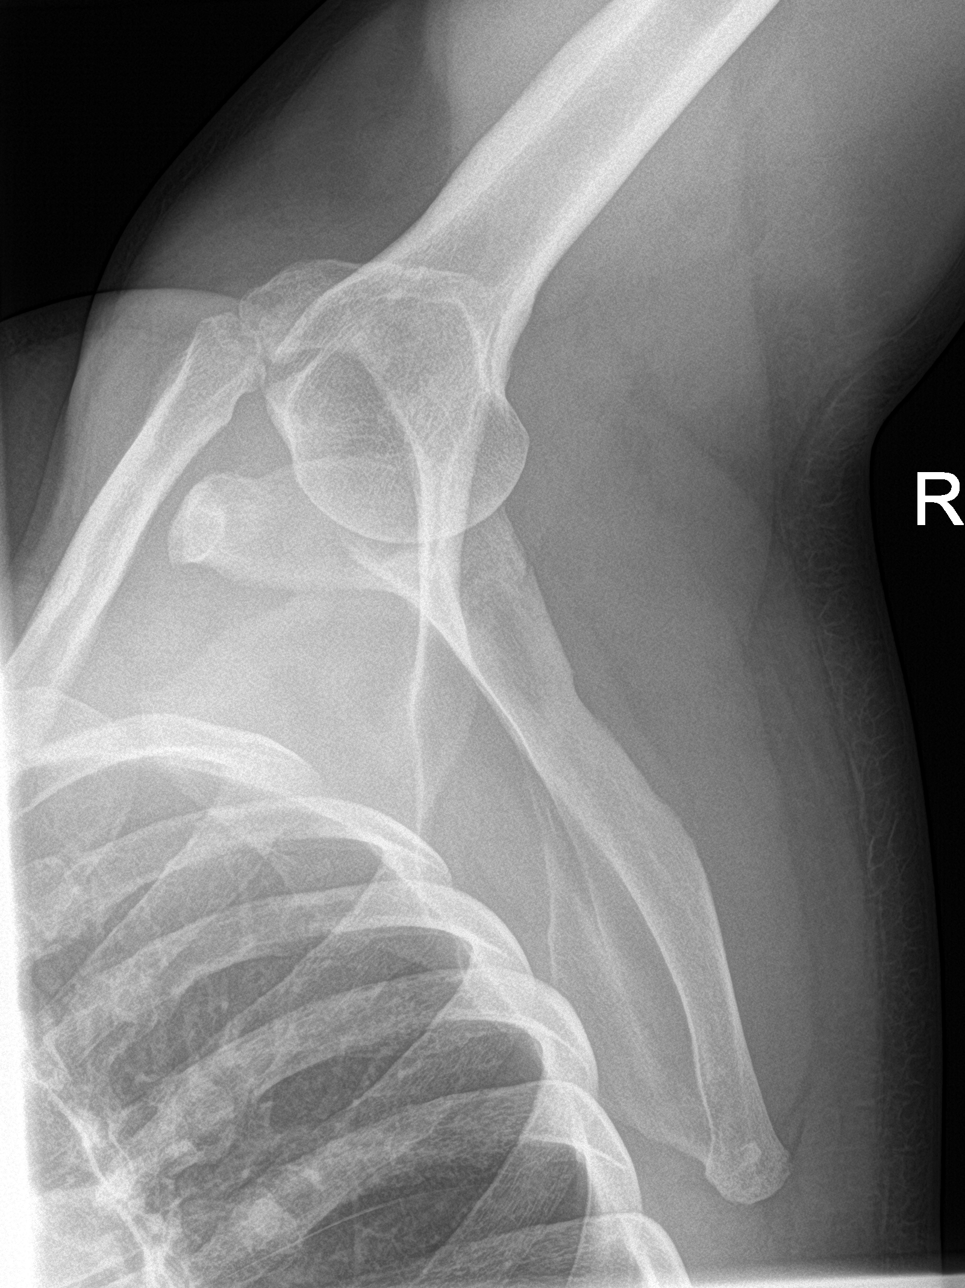

[shoulder ap neutral]
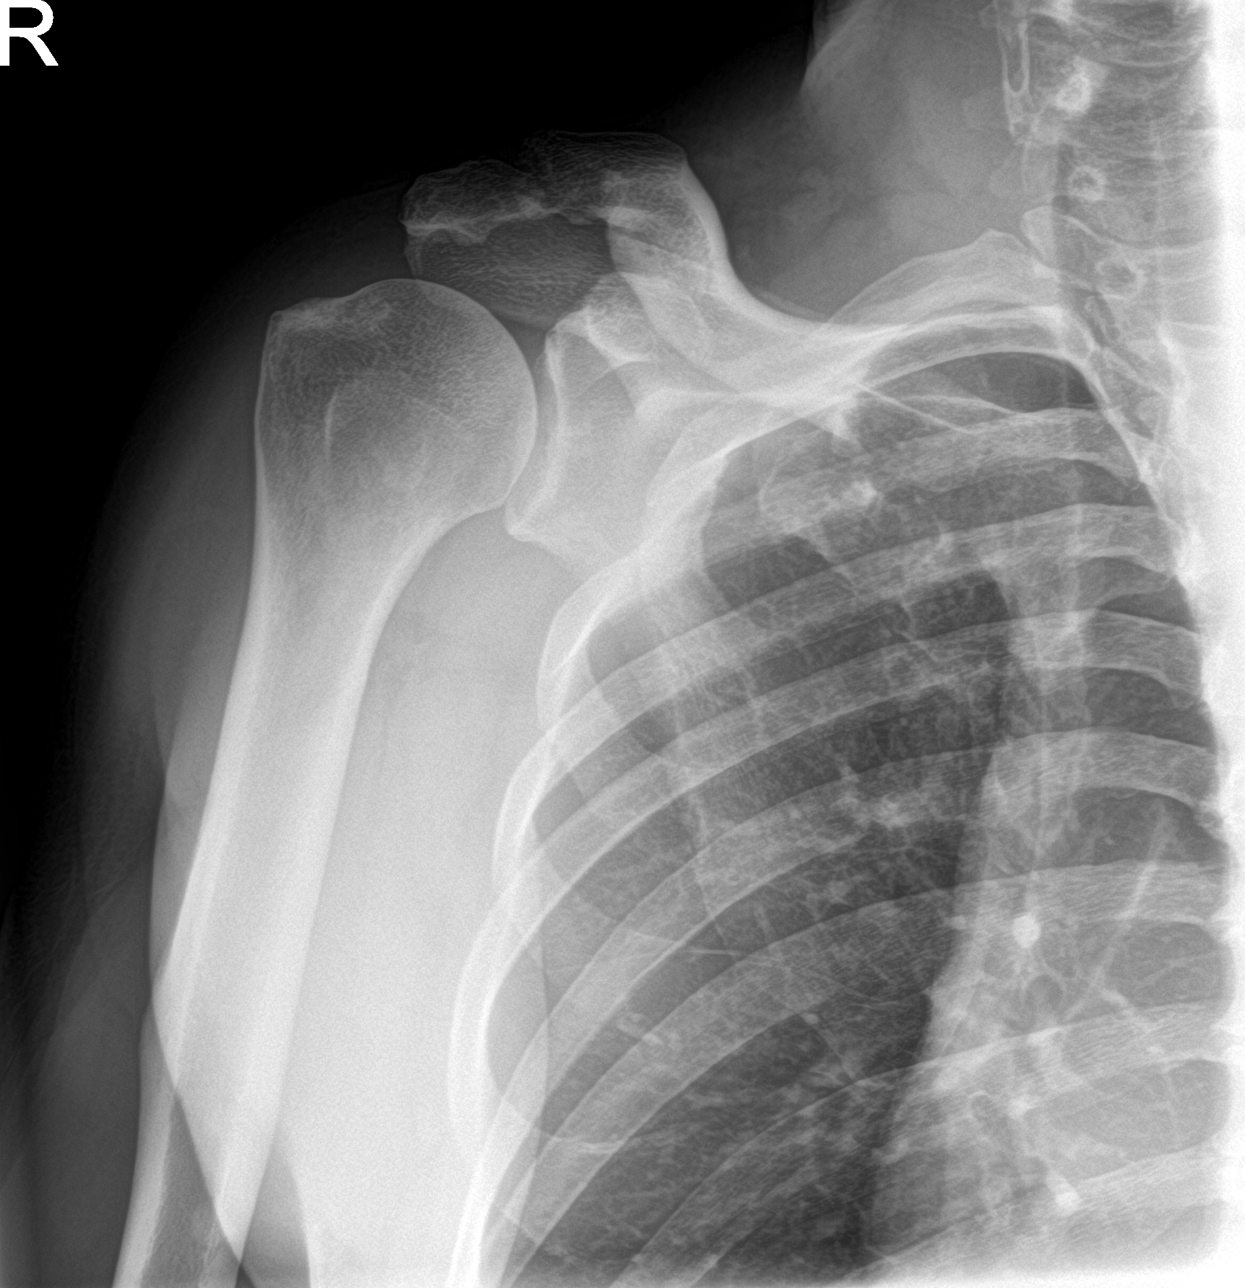

[3 of 3 positions shown; findings below may reference images not displayed]

FINDINGS: There is no evidence of fracture or dislocation. Mild
acromioclavicular spurring. Glenohumeral joint is normal. Minimal
subcortical cystic change in the lateral humeral head can be seen
with underlying rotator cuff arthropathy. Mild soft tissue edema.
IMPRESSION: 1. Mild soft tissue edema without acute osseous abnormality.
2. Mild acromioclavicular degenerative change.
